# Patient Record
Sex: Male | Born: 1940 | Race: White | Hispanic: No | Marital: Married | Smoking: Never smoker
Health system: Southern US, Community
[De-identification: ages and names within clinical notes are randomized; demographics above are authoritative.]

## PROBLEM LIST (undated history)

## (undated) DIAGNOSIS — E785 Hyperlipidemia, unspecified: Secondary | ICD-10-CM

## (undated) DIAGNOSIS — E119 Type 2 diabetes mellitus without complications: Secondary | ICD-10-CM

## (undated) HISTORY — PX: TONSILLECTOMY: SUR1361

## (undated) HISTORY — PX: WISDOM TOOTH EXTRACTION: SHX21

## (undated) HISTORY — PX: PROSTATECTOMY: SHX69

---

## 1999-04-16 ENCOUNTER — Other Ambulatory Visit: Admission: RE | Admit: 1999-04-16 | Discharge: 1999-04-16 | Payer: Self-pay | Admitting: Urology

## 1999-06-02 ENCOUNTER — Encounter: Admission: RE | Admit: 1999-06-02 | Discharge: 1999-08-31 | Payer: Self-pay | Admitting: Radiation Oncology

## 1999-07-30 ENCOUNTER — Ambulatory Visit (HOSPITAL_COMMUNITY): Admission: RE | Admit: 1999-07-30 | Discharge: 1999-07-30 | Payer: Self-pay | Admitting: Gastroenterology

## 1999-08-05 ENCOUNTER — Encounter: Payer: Self-pay | Admitting: Urology

## 1999-08-10 ENCOUNTER — Inpatient Hospital Stay (HOSPITAL_COMMUNITY): Admission: RE | Admit: 1999-08-10 | Discharge: 1999-08-13 | Payer: Self-pay | Admitting: Urology

## 1999-08-14 ENCOUNTER — Emergency Department (HOSPITAL_COMMUNITY): Admission: EM | Admit: 1999-08-14 | Discharge: 1999-08-14 | Payer: Self-pay | Admitting: Emergency Medicine

## 2004-10-29 ENCOUNTER — Ambulatory Visit: Payer: Self-pay | Admitting: Family Medicine

## 2004-11-02 ENCOUNTER — Ambulatory Visit: Payer: Self-pay | Admitting: Family Medicine

## 2004-11-12 ENCOUNTER — Ambulatory Visit: Payer: Self-pay | Admitting: Family Medicine

## 2004-11-16 ENCOUNTER — Ambulatory Visit: Payer: Self-pay | Admitting: Family Medicine

## 2005-03-01 ENCOUNTER — Ambulatory Visit: Payer: Self-pay | Admitting: Family Medicine

## 2005-05-14 ENCOUNTER — Ambulatory Visit: Payer: Self-pay | Admitting: Family Medicine

## 2015-07-15 DIAGNOSIS — L821 Other seborrheic keratosis: Secondary | ICD-10-CM | POA: Diagnosis not present

## 2015-07-15 DIAGNOSIS — C44622 Squamous cell carcinoma of skin of right upper limb, including shoulder: Secondary | ICD-10-CM | POA: Diagnosis not present

## 2015-07-15 DIAGNOSIS — D485 Neoplasm of uncertain behavior of skin: Secondary | ICD-10-CM | POA: Diagnosis not present

## 2015-07-15 DIAGNOSIS — I789 Disease of capillaries, unspecified: Secondary | ICD-10-CM | POA: Diagnosis not present

## 2015-08-01 DIAGNOSIS — L570 Actinic keratosis: Secondary | ICD-10-CM | POA: Diagnosis not present

## 2015-08-01 DIAGNOSIS — L820 Inflamed seborrheic keratosis: Secondary | ICD-10-CM | POA: Diagnosis not present

## 2015-08-01 DIAGNOSIS — C44621 Squamous cell carcinoma of skin of unspecified upper limb, including shoulder: Secondary | ICD-10-CM | POA: Diagnosis not present

## 2015-09-12 DIAGNOSIS — Z85828 Personal history of other malignant neoplasm of skin: Secondary | ICD-10-CM | POA: Diagnosis not present

## 2015-09-12 DIAGNOSIS — L820 Inflamed seborrheic keratosis: Secondary | ICD-10-CM | POA: Diagnosis not present

## 2015-09-12 DIAGNOSIS — L570 Actinic keratosis: Secondary | ICD-10-CM | POA: Diagnosis not present

## 2016-01-21 DIAGNOSIS — Z8546 Personal history of malignant neoplasm of prostate: Secondary | ICD-10-CM | POA: Diagnosis not present

## 2016-01-21 DIAGNOSIS — E785 Hyperlipidemia, unspecified: Secondary | ICD-10-CM | POA: Diagnosis not present

## 2016-01-21 DIAGNOSIS — R634 Abnormal weight loss: Secondary | ICD-10-CM | POA: Diagnosis not present

## 2016-01-22 DIAGNOSIS — R739 Hyperglycemia, unspecified: Secondary | ICD-10-CM | POA: Diagnosis not present

## 2016-01-26 DIAGNOSIS — E785 Hyperlipidemia, unspecified: Secondary | ICD-10-CM | POA: Diagnosis not present

## 2016-01-26 DIAGNOSIS — E119 Type 2 diabetes mellitus without complications: Secondary | ICD-10-CM | POA: Diagnosis not present

## 2016-01-26 DIAGNOSIS — Z23 Encounter for immunization: Secondary | ICD-10-CM | POA: Diagnosis not present

## 2016-01-26 DIAGNOSIS — Z794 Long term (current) use of insulin: Secondary | ICD-10-CM | POA: Diagnosis not present

## 2016-01-26 DIAGNOSIS — R739 Hyperglycemia, unspecified: Secondary | ICD-10-CM | POA: Diagnosis not present

## 2016-02-04 DIAGNOSIS — E119 Type 2 diabetes mellitus without complications: Secondary | ICD-10-CM | POA: Diagnosis not present

## 2016-02-04 DIAGNOSIS — R739 Hyperglycemia, unspecified: Secondary | ICD-10-CM | POA: Diagnosis not present

## 2016-02-04 DIAGNOSIS — Z794 Long term (current) use of insulin: Secondary | ICD-10-CM | POA: Diagnosis not present

## 2016-03-02 DIAGNOSIS — H2513 Age-related nuclear cataract, bilateral: Secondary | ICD-10-CM | POA: Diagnosis not present

## 2016-03-02 DIAGNOSIS — E119 Type 2 diabetes mellitus without complications: Secondary | ICD-10-CM | POA: Diagnosis not present

## 2016-03-08 DIAGNOSIS — E119 Type 2 diabetes mellitus without complications: Secondary | ICD-10-CM | POA: Diagnosis not present

## 2016-03-08 DIAGNOSIS — Z0000 Encounter for general adult medical examination without abnormal findings: Secondary | ICD-10-CM | POA: Diagnosis not present

## 2016-03-08 DIAGNOSIS — Z794 Long term (current) use of insulin: Secondary | ICD-10-CM | POA: Diagnosis not present

## 2016-03-12 DIAGNOSIS — L821 Other seborrheic keratosis: Secondary | ICD-10-CM | POA: Diagnosis not present

## 2016-03-12 DIAGNOSIS — Z85828 Personal history of other malignant neoplasm of skin: Secondary | ICD-10-CM | POA: Diagnosis not present

## 2016-03-12 DIAGNOSIS — D225 Melanocytic nevi of trunk: Secondary | ICD-10-CM | POA: Diagnosis not present

## 2016-03-12 DIAGNOSIS — L570 Actinic keratosis: Secondary | ICD-10-CM | POA: Diagnosis not present

## 2016-03-12 DIAGNOSIS — L814 Other melanin hyperpigmentation: Secondary | ICD-10-CM | POA: Diagnosis not present

## 2016-05-25 DIAGNOSIS — R739 Hyperglycemia, unspecified: Secondary | ICD-10-CM | POA: Diagnosis not present

## 2016-05-25 DIAGNOSIS — R748 Abnormal levels of other serum enzymes: Secondary | ICD-10-CM | POA: Diagnosis not present

## 2016-05-25 DIAGNOSIS — E785 Hyperlipidemia, unspecified: Secondary | ICD-10-CM | POA: Diagnosis not present

## 2016-08-23 DIAGNOSIS — E119 Type 2 diabetes mellitus without complications: Secondary | ICD-10-CM | POA: Diagnosis not present

## 2016-08-23 DIAGNOSIS — E785 Hyperlipidemia, unspecified: Secondary | ICD-10-CM | POA: Diagnosis not present

## 2016-08-23 DIAGNOSIS — Z794 Long term (current) use of insulin: Secondary | ICD-10-CM | POA: Diagnosis not present

## 2016-08-23 DIAGNOSIS — R739 Hyperglycemia, unspecified: Secondary | ICD-10-CM | POA: Diagnosis not present

## 2016-09-14 DIAGNOSIS — Z85828 Personal history of other malignant neoplasm of skin: Secondary | ICD-10-CM | POA: Diagnosis not present

## 2016-09-14 DIAGNOSIS — D225 Melanocytic nevi of trunk: Secondary | ICD-10-CM | POA: Diagnosis not present

## 2016-09-14 DIAGNOSIS — D1801 Hemangioma of skin and subcutaneous tissue: Secondary | ICD-10-CM | POA: Diagnosis not present

## 2016-09-14 DIAGNOSIS — L821 Other seborrheic keratosis: Secondary | ICD-10-CM | POA: Diagnosis not present

## 2016-11-22 DIAGNOSIS — E119 Type 2 diabetes mellitus without complications: Secondary | ICD-10-CM | POA: Diagnosis not present

## 2016-11-22 DIAGNOSIS — Z794 Long term (current) use of insulin: Secondary | ICD-10-CM | POA: Diagnosis not present

## 2017-03-07 DIAGNOSIS — H2513 Age-related nuclear cataract, bilateral: Secondary | ICD-10-CM | POA: Diagnosis not present

## 2017-03-07 DIAGNOSIS — E119 Type 2 diabetes mellitus without complications: Secondary | ICD-10-CM | POA: Diagnosis not present

## 2017-03-11 DIAGNOSIS — Z8546 Personal history of malignant neoplasm of prostate: Secondary | ICD-10-CM | POA: Diagnosis not present

## 2017-03-11 DIAGNOSIS — Z0000 Encounter for general adult medical examination without abnormal findings: Secondary | ICD-10-CM | POA: Diagnosis not present

## 2017-03-11 DIAGNOSIS — E119 Type 2 diabetes mellitus without complications: Secondary | ICD-10-CM | POA: Diagnosis not present

## 2017-03-11 DIAGNOSIS — Z794 Long term (current) use of insulin: Secondary | ICD-10-CM | POA: Diagnosis not present

## 2017-03-11 DIAGNOSIS — E785 Hyperlipidemia, unspecified: Secondary | ICD-10-CM | POA: Diagnosis not present

## 2017-03-17 DIAGNOSIS — L820 Inflamed seborrheic keratosis: Secondary | ICD-10-CM | POA: Diagnosis not present

## 2017-03-17 DIAGNOSIS — D1801 Hemangioma of skin and subcutaneous tissue: Secondary | ICD-10-CM | POA: Diagnosis not present

## 2017-03-17 DIAGNOSIS — L814 Other melanin hyperpigmentation: Secondary | ICD-10-CM | POA: Diagnosis not present

## 2017-03-17 DIAGNOSIS — L570 Actinic keratosis: Secondary | ICD-10-CM | POA: Diagnosis not present

## 2017-03-17 DIAGNOSIS — Z85828 Personal history of other malignant neoplasm of skin: Secondary | ICD-10-CM | POA: Diagnosis not present

## 2017-03-17 DIAGNOSIS — D225 Melanocytic nevi of trunk: Secondary | ICD-10-CM | POA: Diagnosis not present

## 2017-03-17 DIAGNOSIS — L821 Other seborrheic keratosis: Secondary | ICD-10-CM | POA: Diagnosis not present

## 2017-06-10 DIAGNOSIS — E785 Hyperlipidemia, unspecified: Secondary | ICD-10-CM | POA: Diagnosis not present

## 2017-06-10 DIAGNOSIS — E119 Type 2 diabetes mellitus without complications: Secondary | ICD-10-CM | POA: Diagnosis not present

## 2017-06-10 DIAGNOSIS — Z794 Long term (current) use of insulin: Secondary | ICD-10-CM | POA: Diagnosis not present

## 2017-07-06 DIAGNOSIS — E1165 Type 2 diabetes mellitus with hyperglycemia: Secondary | ICD-10-CM | POA: Diagnosis not present

## 2017-07-06 DIAGNOSIS — E1169 Type 2 diabetes mellitus with other specified complication: Secondary | ICD-10-CM | POA: Diagnosis not present

## 2017-07-06 DIAGNOSIS — E785 Hyperlipidemia, unspecified: Secondary | ICD-10-CM | POA: Diagnosis not present

## 2017-07-26 DIAGNOSIS — Z713 Dietary counseling and surveillance: Secondary | ICD-10-CM | POA: Diagnosis not present

## 2017-07-26 DIAGNOSIS — E1165 Type 2 diabetes mellitus with hyperglycemia: Secondary | ICD-10-CM | POA: Diagnosis not present

## 2018-03-07 DIAGNOSIS — H2513 Age-related nuclear cataract, bilateral: Secondary | ICD-10-CM | POA: Diagnosis not present

## 2018-03-07 DIAGNOSIS — E119 Type 2 diabetes mellitus without complications: Secondary | ICD-10-CM | POA: Diagnosis not present

## 2018-03-17 DIAGNOSIS — C44729 Squamous cell carcinoma of skin of left lower limb, including hip: Secondary | ICD-10-CM | POA: Diagnosis not present

## 2018-05-01 DIAGNOSIS — C44729 Squamous cell carcinoma of skin of left lower limb, including hip: Secondary | ICD-10-CM | POA: Diagnosis not present

## 2018-05-15 DIAGNOSIS — Z79899 Other long term (current) drug therapy: Secondary | ICD-10-CM | POA: Diagnosis not present

## 2018-05-15 DIAGNOSIS — E1165 Type 2 diabetes mellitus with hyperglycemia: Secondary | ICD-10-CM | POA: Diagnosis not present

## 2018-05-15 DIAGNOSIS — E785 Hyperlipidemia, unspecified: Secondary | ICD-10-CM | POA: Diagnosis not present

## 2018-05-15 DIAGNOSIS — Z794 Long term (current) use of insulin: Secondary | ICD-10-CM | POA: Diagnosis not present

## 2018-05-15 DIAGNOSIS — E119 Type 2 diabetes mellitus without complications: Secondary | ICD-10-CM | POA: Diagnosis not present

## 2018-05-15 DIAGNOSIS — Z8546 Personal history of malignant neoplasm of prostate: Secondary | ICD-10-CM | POA: Diagnosis not present

## 2018-05-15 DIAGNOSIS — Z0000 Encounter for general adult medical examination without abnormal findings: Secondary | ICD-10-CM | POA: Diagnosis not present

## 2018-05-15 DIAGNOSIS — E1169 Type 2 diabetes mellitus with other specified complication: Secondary | ICD-10-CM | POA: Diagnosis not present

## 2018-06-15 DIAGNOSIS — C44529 Squamous cell carcinoma of skin of other part of trunk: Secondary | ICD-10-CM | POA: Diagnosis not present

## 2018-06-15 DIAGNOSIS — Z85828 Personal history of other malignant neoplasm of skin: Secondary | ICD-10-CM | POA: Diagnosis not present

## 2018-06-15 DIAGNOSIS — L905 Scar conditions and fibrosis of skin: Secondary | ICD-10-CM | POA: Diagnosis not present

## 2018-10-06 DIAGNOSIS — R7989 Other specified abnormal findings of blood chemistry: Secondary | ICD-10-CM | POA: Diagnosis not present

## 2018-10-06 DIAGNOSIS — Z794 Long term (current) use of insulin: Secondary | ICD-10-CM | POA: Diagnosis not present

## 2018-10-06 DIAGNOSIS — E785 Hyperlipidemia, unspecified: Secondary | ICD-10-CM | POA: Diagnosis not present

## 2018-10-06 DIAGNOSIS — E1165 Type 2 diabetes mellitus with hyperglycemia: Secondary | ICD-10-CM | POA: Diagnosis not present

## 2018-12-04 ENCOUNTER — Other Ambulatory Visit: Payer: Self-pay

## 2019-01-09 DIAGNOSIS — E119 Type 2 diabetes mellitus without complications: Secondary | ICD-10-CM | POA: Diagnosis not present

## 2019-01-09 DIAGNOSIS — Z794 Long term (current) use of insulin: Secondary | ICD-10-CM | POA: Diagnosis not present

## 2019-01-09 DIAGNOSIS — R7989 Other specified abnormal findings of blood chemistry: Secondary | ICD-10-CM | POA: Diagnosis not present

## 2019-01-09 DIAGNOSIS — E785 Hyperlipidemia, unspecified: Secondary | ICD-10-CM | POA: Diagnosis not present

## 2019-03-08 DIAGNOSIS — H2513 Age-related nuclear cataract, bilateral: Secondary | ICD-10-CM | POA: Diagnosis not present

## 2019-03-08 DIAGNOSIS — Z794 Long term (current) use of insulin: Secondary | ICD-10-CM | POA: Diagnosis not present

## 2019-03-08 DIAGNOSIS — E119 Type 2 diabetes mellitus without complications: Secondary | ICD-10-CM | POA: Diagnosis not present

## 2019-10-30 DIAGNOSIS — E1169 Type 2 diabetes mellitus with other specified complication: Secondary | ICD-10-CM | POA: Diagnosis not present

## 2019-10-30 DIAGNOSIS — E119 Type 2 diabetes mellitus without complications: Secondary | ICD-10-CM | POA: Diagnosis not present

## 2019-10-30 DIAGNOSIS — Z0000 Encounter for general adult medical examination without abnormal findings: Secondary | ICD-10-CM | POA: Diagnosis not present

## 2019-10-30 DIAGNOSIS — Z794 Long term (current) use of insulin: Secondary | ICD-10-CM | POA: Diagnosis not present

## 2019-10-30 DIAGNOSIS — E785 Hyperlipidemia, unspecified: Secondary | ICD-10-CM | POA: Diagnosis not present

## 2020-02-01 DIAGNOSIS — Z794 Long term (current) use of insulin: Secondary | ICD-10-CM | POA: Diagnosis not present

## 2020-02-01 DIAGNOSIS — E785 Hyperlipidemia, unspecified: Secondary | ICD-10-CM | POA: Diagnosis not present

## 2020-02-01 DIAGNOSIS — E669 Obesity, unspecified: Secondary | ICD-10-CM | POA: Diagnosis not present

## 2020-02-01 DIAGNOSIS — E119 Type 2 diabetes mellitus without complications: Secondary | ICD-10-CM | POA: Diagnosis not present

## 2020-02-01 DIAGNOSIS — E1169 Type 2 diabetes mellitus with other specified complication: Secondary | ICD-10-CM | POA: Diagnosis not present

## 2020-04-19 ENCOUNTER — Emergency Department (HOSPITAL_COMMUNITY): Payer: PPO

## 2020-04-19 ENCOUNTER — Inpatient Hospital Stay (HOSPITAL_COMMUNITY): Payer: PPO

## 2020-04-19 ENCOUNTER — Other Ambulatory Visit: Payer: Self-pay

## 2020-04-19 ENCOUNTER — Inpatient Hospital Stay (HOSPITAL_COMMUNITY)
Admission: EM | Admit: 2020-04-19 | Discharge: 2020-05-03 | DRG: 208 | Disposition: E | Payer: PPO | Attending: Internal Medicine | Admitting: Internal Medicine

## 2020-04-19 ENCOUNTER — Encounter (HOSPITAL_COMMUNITY): Payer: Self-pay | Admitting: Internal Medicine

## 2020-04-19 DIAGNOSIS — E1122 Type 2 diabetes mellitus with diabetic chronic kidney disease: Secondary | ICD-10-CM | POA: Diagnosis not present

## 2020-04-19 DIAGNOSIS — J1282 Pneumonia due to coronavirus disease 2019: Secondary | ICD-10-CM | POA: Diagnosis not present

## 2020-04-19 DIAGNOSIS — Z794 Long term (current) use of insulin: Secondary | ICD-10-CM | POA: Diagnosis not present

## 2020-04-19 DIAGNOSIS — I129 Hypertensive chronic kidney disease with stage 1 through stage 4 chronic kidney disease, or unspecified chronic kidney disease: Secondary | ICD-10-CM | POA: Diagnosis present

## 2020-04-19 DIAGNOSIS — Z4682 Encounter for fitting and adjustment of non-vascular catheter: Secondary | ICD-10-CM | POA: Diagnosis not present

## 2020-04-19 DIAGNOSIS — J9601 Acute respiratory failure with hypoxia: Secondary | ICD-10-CM | POA: Diagnosis present

## 2020-04-19 DIAGNOSIS — N179 Acute kidney failure, unspecified: Secondary | ICD-10-CM | POA: Diagnosis present

## 2020-04-19 DIAGNOSIS — I2699 Other pulmonary embolism without acute cor pulmonale: Secondary | ICD-10-CM

## 2020-04-19 DIAGNOSIS — U071 COVID-19: Secondary | ICD-10-CM | POA: Diagnosis not present

## 2020-04-19 DIAGNOSIS — G9341 Metabolic encephalopathy: Secondary | ICD-10-CM | POA: Diagnosis not present

## 2020-04-19 DIAGNOSIS — A4189 Other specified sepsis: Secondary | ICD-10-CM | POA: Diagnosis not present

## 2020-04-19 DIAGNOSIS — E1165 Type 2 diabetes mellitus with hyperglycemia: Secondary | ICD-10-CM | POA: Diagnosis not present

## 2020-04-19 DIAGNOSIS — Z8546 Personal history of malignant neoplasm of prostate: Secondary | ICD-10-CM

## 2020-04-19 DIAGNOSIS — Z87890 Personal history of sex reassignment: Secondary | ICD-10-CM | POA: Diagnosis not present

## 2020-04-19 DIAGNOSIS — E782 Mixed hyperlipidemia: Secondary | ICD-10-CM | POA: Diagnosis not present

## 2020-04-19 DIAGNOSIS — I10 Essential (primary) hypertension: Secondary | ICD-10-CM | POA: Diagnosis present

## 2020-04-19 DIAGNOSIS — I491 Atrial premature depolarization: Secondary | ICD-10-CM | POA: Diagnosis not present

## 2020-04-19 DIAGNOSIS — R0902 Hypoxemia: Secondary | ICD-10-CM | POA: Diagnosis not present

## 2020-04-19 DIAGNOSIS — R570 Cardiogenic shock: Secondary | ICD-10-CM | POA: Diagnosis not present

## 2020-04-19 DIAGNOSIS — J9312 Secondary spontaneous pneumothorax: Secondary | ICD-10-CM | POA: Diagnosis not present

## 2020-04-19 DIAGNOSIS — Z86711 Personal history of pulmonary embolism: Secondary | ICD-10-CM

## 2020-04-19 DIAGNOSIS — Z452 Encounter for adjustment and management of vascular access device: Secondary | ICD-10-CM

## 2020-04-19 DIAGNOSIS — E1169 Type 2 diabetes mellitus with other specified complication: Secondary | ICD-10-CM | POA: Diagnosis present

## 2020-04-19 DIAGNOSIS — J9311 Primary spontaneous pneumothorax: Secondary | ICD-10-CM | POA: Diagnosis not present

## 2020-04-19 DIAGNOSIS — Z823 Family history of stroke: Secondary | ICD-10-CM

## 2020-04-19 DIAGNOSIS — N1831 Chronic kidney disease, stage 3a: Secondary | ICD-10-CM | POA: Diagnosis present

## 2020-04-19 DIAGNOSIS — J939 Pneumothorax, unspecified: Secondary | ICD-10-CM | POA: Diagnosis not present

## 2020-04-19 DIAGNOSIS — Z4659 Encounter for fitting and adjustment of other gastrointestinal appliance and device: Secondary | ICD-10-CM

## 2020-04-19 DIAGNOSIS — R7989 Other specified abnormal findings of blood chemistry: Secondary | ICD-10-CM | POA: Diagnosis present

## 2020-04-19 DIAGNOSIS — R918 Other nonspecific abnormal finding of lung field: Secondary | ICD-10-CM | POA: Diagnosis not present

## 2020-04-19 DIAGNOSIS — I472 Ventricular tachycardia: Secondary | ICD-10-CM | POA: Diagnosis not present

## 2020-04-19 DIAGNOSIS — R34 Anuria and oliguria: Secondary | ICD-10-CM | POA: Diagnosis not present

## 2020-04-19 DIAGNOSIS — Z8249 Family history of ischemic heart disease and other diseases of the circulatory system: Secondary | ICD-10-CM

## 2020-04-19 DIAGNOSIS — J80 Acute respiratory distress syndrome: Secondary | ICD-10-CM | POA: Diagnosis not present

## 2020-04-19 DIAGNOSIS — R17 Unspecified jaundice: Secondary | ICD-10-CM | POA: Diagnosis not present

## 2020-04-19 DIAGNOSIS — Z66 Do not resuscitate: Secondary | ICD-10-CM | POA: Diagnosis not present

## 2020-04-19 DIAGNOSIS — R0689 Other abnormalities of breathing: Secondary | ICD-10-CM | POA: Diagnosis not present

## 2020-04-19 DIAGNOSIS — J9600 Acute respiratory failure, unspecified whether with hypoxia or hypercapnia: Secondary | ICD-10-CM

## 2020-04-19 DIAGNOSIS — J9382 Other air leak: Secondary | ICD-10-CM | POA: Diagnosis not present

## 2020-04-19 DIAGNOSIS — J9690 Respiratory failure, unspecified, unspecified whether with hypoxia or hypercapnia: Secondary | ICD-10-CM | POA: Diagnosis not present

## 2020-04-19 DIAGNOSIS — R6521 Severe sepsis with septic shock: Secondary | ICD-10-CM | POA: Diagnosis not present

## 2020-04-19 DIAGNOSIS — Z01818 Encounter for other preprocedural examination: Secondary | ICD-10-CM

## 2020-04-19 DIAGNOSIS — R0602 Shortness of breath: Secondary | ICD-10-CM | POA: Diagnosis not present

## 2020-04-19 DIAGNOSIS — J189 Pneumonia, unspecified organism: Secondary | ICD-10-CM | POA: Diagnosis not present

## 2020-04-19 DIAGNOSIS — R609 Edema, unspecified: Secondary | ICD-10-CM | POA: Diagnosis not present

## 2020-04-19 HISTORY — DX: Type 2 diabetes mellitus with other specified complication: E11.69

## 2020-04-19 HISTORY — DX: Hyperlipidemia, unspecified: E78.5

## 2020-04-19 HISTORY — DX: Mixed hyperlipidemia: E78.2

## 2020-04-19 HISTORY — DX: Type 2 diabetes mellitus without complications: E11.9

## 2020-04-19 LAB — CBC WITH DIFFERENTIAL/PLATELET
Abs Immature Granulocytes: 0.23 10*3/uL — ABNORMAL HIGH (ref 0.00–0.07)
Basophils Absolute: 0.1 10*3/uL (ref 0.0–0.1)
Basophils Relative: 1 %
Eosinophils Absolute: 0 10*3/uL (ref 0.0–0.5)
Eosinophils Relative: 0 %
HCT: 51.2 % (ref 39.0–52.0)
Hemoglobin: 17 g/dL (ref 13.0–17.0)
Immature Granulocytes: 1 %
Lymphocytes Relative: 9 %
Lymphs Abs: 1.8 10*3/uL (ref 0.7–4.0)
MCH: 30.4 pg (ref 26.0–34.0)
MCHC: 33.2 g/dL (ref 30.0–36.0)
MCV: 91.4 fL (ref 80.0–100.0)
Monocytes Absolute: 0.7 10*3/uL (ref 0.1–1.0)
Monocytes Relative: 3 %
Neutro Abs: 16.9 10*3/uL — ABNORMAL HIGH (ref 1.7–7.7)
Neutrophils Relative %: 86 %
Platelets: 329 10*3/uL (ref 150–400)
RBC: 5.6 MIL/uL (ref 4.22–5.81)
RDW: 13.1 % (ref 11.5–15.5)
WBC: 19.7 10*3/uL — ABNORMAL HIGH (ref 4.0–10.5)
nRBC: 0 % (ref 0.0–0.2)

## 2020-04-19 LAB — RESP PANEL BY RT-PCR (FLU A&B, COVID) ARPGX2
Influenza A by PCR: NEGATIVE
Influenza B by PCR: NEGATIVE
SARS Coronavirus 2 by RT PCR: POSITIVE — AB

## 2020-04-19 LAB — BLOOD GAS, ARTERIAL
Acid-base deficit: 1.5 mmol/L (ref 0.0–2.0)
Bicarbonate: 20.8 mmol/L (ref 20.0–28.0)
FIO2: 21
O2 Saturation: 87.5 %
Patient temperature: 98.6
pCO2 arterial: 30.1 mmHg — ABNORMAL LOW (ref 32.0–48.0)
pH, Arterial: 7.454 — ABNORMAL HIGH (ref 7.350–7.450)
pO2, Arterial: 58.8 mmHg — ABNORMAL LOW (ref 83.0–108.0)

## 2020-04-19 LAB — COMPREHENSIVE METABOLIC PANEL
ALT: 23 U/L (ref 0–44)
AST: 28 U/L (ref 15–41)
Albumin: 2.6 g/dL — ABNORMAL LOW (ref 3.5–5.0)
Alkaline Phosphatase: 96 U/L (ref 38–126)
Anion gap: 19 — ABNORMAL HIGH (ref 5–15)
BUN: 41 mg/dL — ABNORMAL HIGH (ref 8–23)
CO2: 19 mmol/L — ABNORMAL LOW (ref 22–32)
Calcium: 8.5 mg/dL — ABNORMAL LOW (ref 8.9–10.3)
Chloride: 99 mmol/L (ref 98–111)
Creatinine, Ser: 1.29 mg/dL — ABNORMAL HIGH (ref 0.61–1.24)
Glucose, Bld: 158 mg/dL — ABNORMAL HIGH (ref 70–99)
Potassium: 3.9 mmol/L (ref 3.5–5.1)
Sodium: 137 mmol/L (ref 135–145)
Total Bilirubin: 1.7 mg/dL — ABNORMAL HIGH (ref 0.3–1.2)
Total Protein: 7.9 g/dL (ref 6.5–8.1)

## 2020-04-19 LAB — LACTATE DEHYDROGENASE: LDH: 411 U/L — ABNORMAL HIGH (ref 98–192)

## 2020-04-19 LAB — FIBRINOGEN: Fibrinogen: >800 mg/dL — ABNORMAL HIGH (ref 210–475)

## 2020-04-19 LAB — D-DIMER, QUANTITATIVE: D-Dimer, Quant: >20 ug/mL-FEU — ABNORMAL HIGH (ref 0.00–0.50)

## 2020-04-19 LAB — C-REACTIVE PROTEIN: CRP: 31.4 mg/dL — ABNORMAL HIGH (ref <1.0)

## 2020-04-19 LAB — TROPONIN I (HIGH SENSITIVITY): Troponin I (High Sensitivity): 8 ng/L (ref <18)

## 2020-04-19 LAB — FERRITIN: Ferritin: 1054 ng/mL — ABNORMAL HIGH (ref 24–336)

## 2020-04-19 LAB — PROCALCITONIN: Procalcitonin: 0.45 ng/mL

## 2020-04-19 MED ORDER — GUAIFENESIN-DM 100-10 MG/5ML PO SYRP: 10.0000 mL | ORAL_SOLUTION | ORAL | Status: DC | PRN

## 2020-04-19 MED ORDER — ZINC SULFATE 220 (50 ZN) MG PO CAPS
220.0000 mg | ORAL_CAPSULE | Freq: Every day | ORAL | Status: DC
  Administered 2020-04-21: 12:00:00 220 mg via ORAL
  Filled 2020-04-19: qty 1

## 2020-04-19 MED ORDER — ALBUTEROL SULFATE HFA 108 (90 BASE) MCG/ACT IN AERS: 2.0000 | INHALATION_SPRAY | RESPIRATORY_TRACT | Status: DC | PRN

## 2020-04-19 MED ORDER — HYDROCODONE-ACETAMINOPHEN 5-325 MG PO TABS
1.0000 | ORAL_TABLET | Freq: Four times a day (QID) | ORAL | Status: DC | PRN
Start: 2020-04-19 — End: 2020-04-19

## 2020-04-19 MED ORDER — FENTANYL CITRATE (PF) 100 MCG/2ML IJ SOLN: 100.0000 ug | Freq: Once | INTRAMUSCULAR | Status: AC

## 2020-04-19 MED ORDER — BARICITINIB 2 MG PO TABS
2.0000 mg | ORAL_TABLET | Freq: Every day | ORAL | Status: DC
  Administered 2020-04-20: 01:00:00 2 mg via ORAL
  Filled 2020-04-19 (×2): qty 1

## 2020-04-19 MED ORDER — ORAL CARE MOUTH RINSE
15.0000 mL | Freq: Two times a day (BID) | OROMUCOSAL | Status: DC
  Administered 2020-04-20 (×2): 15 mL via OROMUCOSAL

## 2020-04-19 MED ORDER — LACTATED RINGERS IV BOLUS
1000.0000 mL | Freq: Once | INTRAVENOUS | Status: AC
  Administered 2020-04-20: 1000 mL via INTRAVENOUS

## 2020-04-19 MED ORDER — LIDOCAINE HCL 2 % IJ SOLN
INTRAMUSCULAR | Status: AC
  Filled 2020-04-19: qty 20

## 2020-04-19 MED ORDER — ACETAMINOPHEN 325 MG PO TABS: 650.0000 mg | ORAL_TABLET | Freq: Four times a day (QID) | ORAL | Status: DC | PRN

## 2020-04-19 MED ORDER — ONDANSETRON HCL 4 MG/2ML IJ SOLN: 4.0000 mg | Freq: Four times a day (QID) | INTRAMUSCULAR | Status: DC | PRN

## 2020-04-19 MED ORDER — MORPHINE SULFATE (PF) 2 MG/ML IV SOLN: 2.0000 mg | INTRAVENOUS | Status: DC | PRN

## 2020-04-19 MED ORDER — SODIUM CHLORIDE 0.9 % IV SOLN
200.0000 mg | Freq: Once | INTRAVENOUS | Status: AC
  Administered 2020-04-19: 20:00:00 200 mg via INTRAVENOUS
  Filled 2020-04-19: qty 200

## 2020-04-19 MED ORDER — CHLORHEXIDINE GLUCONATE CLOTH 2 % EX PADS
6.0000 | MEDICATED_PAD | Freq: Every day | CUTANEOUS | Status: DC
  Administered 2020-04-20 – 2020-04-22 (×4): 6 via TOPICAL

## 2020-04-19 MED ORDER — HYDROCODONE-ACETAMINOPHEN 5-325 MG PO TABS: 1.0000 | ORAL_TABLET | ORAL | Status: DC | PRN

## 2020-04-19 MED ORDER — SODIUM CHLORIDE 0.9 % IV SOLN
100.0000 mg | Freq: Every day | INTRAVENOUS | Status: DC
  Administered 2020-04-20 – 2020-04-22 (×3): 100 mg via INTRAVENOUS
  Filled 2020-04-19 (×3): qty 20

## 2020-04-19 MED ORDER — INSULIN ASPART 100 UNIT/ML ~~LOC~~ SOLN
0.0000 [IU] | Freq: Four times a day (QID) | SUBCUTANEOUS | Status: DC
  Administered 2020-04-20: 05:00:00 3 [IU] via SUBCUTANEOUS

## 2020-04-19 MED ORDER — SODIUM CHLORIDE 0.9% FLUSH
10.0000 mL | Freq: Three times a day (TID) | INTRAVENOUS | Status: DC
  Administered 2020-04-20 – 2020-04-21 (×6): 10 mL

## 2020-04-19 MED ORDER — ASCORBIC ACID 500 MG PO TABS
500.0000 mg | ORAL_TABLET | Freq: Every day | ORAL | Status: DC
  Administered 2020-04-21: 12:00:00 500 mg via ORAL
  Filled 2020-04-19: qty 1

## 2020-04-19 MED ORDER — DEXAMETHASONE SODIUM PHOSPHATE 10 MG/ML IJ SOLN
6.0000 mg | INTRAMUSCULAR | Status: DC
  Administered 2020-04-19: 19:00:00 6 mg via INTRAVENOUS
  Filled 2020-04-19: qty 1

## 2020-04-19 MED ORDER — POLYETHYLENE GLYCOL 3350 17 G PO PACK: 17.0000 g | PACK | Freq: Every day | ORAL | Status: DC | PRN

## 2020-04-19 MED ORDER — LACTATED RINGERS IV SOLN: INTRAVENOUS | Status: AC

## 2020-04-19 MED ORDER — ONDANSETRON HCL 4 MG PO TABS: 4.0000 mg | ORAL_TABLET | Freq: Four times a day (QID) | ORAL | Status: DC | PRN

## 2020-04-19 MED ORDER — ENOXAPARIN SODIUM 40 MG/0.4ML ~~LOC~~ SOLN: 40.0000 mg | SUBCUTANEOUS | Status: DC

## 2020-04-19 MED ORDER — LORAZEPAM 2 MG/ML IJ SOLN
2.0000 mg | Freq: Once | INTRAMUSCULAR | Status: DC
  Filled 2020-04-19: qty 1

## 2020-04-19 MED ORDER — IOHEXOL 350 MG/ML SOLN
100.0000 mL | Freq: Once | INTRAVENOUS | Status: AC | PRN
  Administered 2020-04-20: 100 mL via INTRAVENOUS

## 2020-04-19 MED ORDER — CHLORHEXIDINE GLUCONATE 0.12 % MT SOLN
15.0000 mL | Freq: Two times a day (BID) | OROMUCOSAL | Status: DC
  Administered 2020-04-20 (×3): 15 mL via OROMUCOSAL
  Filled 2020-04-19 (×2): qty 15

## 2020-04-19 MED ORDER — FENTANYL CITRATE (PF) 100 MCG/2ML IJ SOLN
INTRAMUSCULAR | Status: AC
  Administered 2020-04-19: 21:00:00 100 ug via INTRAVENOUS
  Filled 2020-04-19: qty 2

## 2020-04-19 MED ORDER — INSULIN GLARGINE 100 UNIT/ML ~~LOC~~ SOLN
10.0000 [IU] | Freq: Every day | SUBCUTANEOUS | Status: DC
  Administered 2020-04-20 – 2020-04-22 (×3): 10 [IU] via SUBCUTANEOUS
  Filled 2020-04-19 (×3): qty 0.1

## 2020-04-19 NOTE — ED Notes (Signed)
Pt to person and place. Pt is on high flow with nonrebreather. Pt is sating 85 to 90. Pt lung sound on right are diminished and on the left are rhochi

## 2020-04-19 NOTE — H&P (Signed)
History and Physical    Cody Morgan FBP:[id number] DOB: 28-Nov-1940 DOA: 04/21/2020  PCP: Pcp, No  Patient coming from: Home via EMS   Chief Complaint:  Chief Complaint  Patient presents with  . Shortness of Breath     HPI:    79 year old patient that was assigned male sex at birth and now identifies as male or male gender with past medical history of diabetes mellitus type 2, hyperlipidemia, hypertension, remote history of prostate cancer, obesity and remote history of gender firming surgery of the genitalia approximately 20 years ago who presents to [facility] long hospital emergency department with severe shortness of breath via EMS.  Patient is currently a poor historian and majority the history has been obtained from the daughter via phone conversation.   According to the daughter, the patient and his wife both began to develop shortness of breath and cough the weekend after Thanksgiving.  At the time, the patient's symptoms were mild and he remained at home with his wife in the weeks that followed.  The patient did not seek medical attention.  The patient was not vaccinated for COVID-19.  In the weeks that followed both the patient's and his wife symptoms continued to worsen.  Patient's wife decompensated to the point where she had to be admitted to [facility] long hospital on 12/8.  His wife clinically declined and unfortunately expired on 12/15 of Covid related complications.  According to the daughter, in the past 48 hours in particular patient developed rapidly worsening shortness of breath.  Patient has also been experiencing associated weakness and extremely poor oral intake in the past several days.  EMS was contacted today and the patient was urgently brought into [facility] hospital emergency from and for evaluation.  Upon evaluation in the emergency room patient was found to be hypoxic with oxygen saturations in the 70s with evidence of a right-sided pneumothorax.  Chest  tube with successfully placed by Dr. Eulis Foster with the emergency department.  Dr. Eulis Foster also consulted Dr. Carlis Abbott with CCM who graciously came to evaluate the patient.  Dr. Carlis Abbott felt that status post chest tube placement the patient is clinically somewhat improved and recommended hospitalization on the hospitalist service.  The hospitalist group has now been called to assess the patient admission the hospital  Review of Systems:   Review of Systems  Constitutional: Positive for malaise/fatigue.  Respiratory: Positive for cough and shortness of breath.   Neurological: Positive for weakness.  All other systems reviewed and are negative.   Past Medical History:  Diagnosis Date  . Diabetes mellitus type 2 in nonobese (HCC)   . HLD (hyperlipidemia)   . Mixed hyperlipidemia due to type 2 diabetes mellitus ([place]) 04/20/2020    Past Surgical History:  Procedure Laterality Date  . PROSTATECTOMY    . TONSILLECTOMY    . WISDOM TOOTH EXTRACTION       reports that he has never smoked. He has never used smokeless tobacco. He reports previous alcohol use. No history on file for drug use.  Not on File  Family History  Problem Relation Age of Onset  . Cancer Mother   . Heart disease Father   . Stroke Maternal Grandmother   . Heart disease Paternal Grandfather      Prior to Admission medications   Not on File    Physical Exam: Vitals:   04/28/2020 1945 04/14/2020 2000 04/09/2020 2015 04/08/2020 2030  BP: 120/77 130/81 123/76 (!) 142/85  Pulse: (!) 101  (!) 101 Marland Kitchen)  111  Resp: (!) 37  (!) 46 (!) 39  Temp:      TempSrc:      SpO2:   (!) 87% (!) 85%   Constitutional: Lethargic but arousable and oriented x2, patient is currently in respiratory distress.   Skin: no rashes, no lesions, notably poor skin turgor.   Eyes: Pupils are equally reactive to light.  No evidence of scleral icterus or conjunctival pallor.  ENMT: Dry mucous membranes noted.  Posterior pharynx clear of any exudate or lesions.    Neck: normal, supple, no masses, no thyromegaly.  No evidence of jugular venous distension.   Respiratory: Slightly diminished breath sounds throughout the right lung fields although improved status post chest tube placement.  Diffuse rales noted bilaterally.  Patient continues to be tachypneic without evidence of accessory muscle use. Cardiovascular: Tachycardic rate with regular rhythm no murmurs / rubs / gallops. No extremity edema. 2+ pedal pulses. No carotid bruits.  Chest:   Right-sided chest tube is in place.  Dressings clean dry and intact with surrounding tenderness.  No evidence of  crepitus or deformity.   Back:   Nontender without crepitus or deformity. Abdomen: Abdomen is soft and nontender.  No evidence of intra-abdominal masses.  Positive bowel sounds noted in all quadrants.   Musculoskeletal: No joint deformity upper and lower extremities. Good ROM, no contractures. Normal muscle tone.  Neurologic: Patient is lethargic and arousable but oriented x2.  Sensation intact.  Patient moving all 4 extremities spontaneously.  Patient is following all commands.  Patient is responsive to verbal stimuli.   Psychiatric: Psychiatric evaluation limited due to lethargy and intermittent confusion.  Patient currently does not seem to possess insight as to his current situation.  Labs on Admission: I have personally reviewed following labs and imaging studies -   CBC: Recent Labs  Lab 04/21/2020 1856  WBC 19.7*  NEUTROABS 16.9*  HGB 17.0  HCT 51.2  MCV 91.4  PLT 263   Basic Metabolic Panel: Recent Labs  Lab 04/17/2020 1856  NA 137  K 3.9  CL 99  CO2 19*  GLUCOSE 158*  BUN 41*  CREATININE 1.29*  CALCIUM 8.5*   GFR: CrCl cannot be calculated (Unknown ideal weight.). Liver Function Tests: Recent Labs  Lab 04/25/2020 1856  AST 28  ALT 23  ALKPHOS 96  BILITOT 1.7*  PROT 7.9  ALBUMIN 2.6*   No results for input(s): LIPASE, AMYLASE in the last 168 hours. No results for input(s):  AMMONIA in the last 168 hours. Coagulation Profile: No results for input(s): INR, PROTIME in the last 168 hours. Cardiac Enzymes: No results for input(s): CKTOTAL, CKMB, CKMBINDEX, TROPONINI in the last 168 hours. BNP (last 3 results) No results for input(s): PROBNP in the last 8760 hours. HbA1C: No results for input(s): HGBA1C in the last 72 hours. CBG: No results for input(s): GLUCAP in the last 168 hours. Lipid Profile: No results for input(s): CHOL, HDL, LDLCALC, TRIG, CHOLHDL, LDLDIRECT in the last 72 hours. Thyroid Function Tests: No results for input(s): TSH, T4TOTAL, FREET4, T3FREE, THYROIDAB in the last 72 hours. Anemia Panel: Recent Labs    04/16/2020 1856  FERRITIN 1,054*   Urine analysis: No results found for: COLORURINE, APPEARANCEUR, LABSPEC, PHURINE, GLUCOSEU, HGBUR, BILIRUBINUR, KETONESUR, PROTEINUR, UROBILINOGEN, NITRITE, LEUKOCYTESUR  Radiological Exams on Admission - Personally Reviewed: DG Chest 1 View  Result Date: 04/21/2020 CLINICAL DATA:  Right-sided chest tube placement, pneumothorax EXAM: CHEST  1 VIEW COMPARISON:  04/26/2020 FINDINGS: Single frontal view of  the chest demonstrates right-sided pigtail drainage catheter coiled over the medial right lung base. Near complete resolution of right pneumothorax, with less than 10% residual right apical pneumothorax identified. Widespread bilateral airspace disease consistent with known history of COVID-19 pneumonia. No effusion. Cardiac silhouette obscured by lung consolidation. IMPRESSION: 1. Near complete resolution of right pneumothorax after pigtail drainage catheter placement. 2. Widespread bilateral airspace disease consistent with known COVID-19 pneumonia. Electronically Signed   By: Randa Ngo M.D.   On: 04/14/2020 21:21   DG Chest Port 1 View  Result Date: 04/17/2020 CLINICAL DATA:  Short of breath for several days, hypoxia EXAM: PORTABLE CHEST 1 VIEW COMPARISON:  None. FINDINGS: Single frontal view of  the chest demonstrates large right-sided pneumothorax volume estimated greater than 50%. No evidence of mediastinal shift or tension effect. There is widespread bilateral airspace disease which may reflect multifocal pneumonia or edema. No pleural effusion. Cardiac silhouette is obscured by the lung consolidation. No acute bony abnormalities. IMPRESSION: 1. Large right pneumothorax, volume estimated greater than 50%. No tension effect or midline shift. 2. Widespread bilateral airspace disease which may reflect multifocal infection or edema. Critical Value/emergent results were called by telephone at the time of interpretation on 04/14/2020 at 8:12 pm to provider [facility] , who verbally acknowledged these results. Electronically Signed   By: Randa Ngo M.D.   On: 04/20/2020 20:14    Telemetry: Personally reviewed.  Rhythm is sinus tachycardia heart rate of 110 bpm.  Assessment/Plan Principal Problem:   Acute respiratory failure with hypoxia (HCC)  Evidence of severe acute hypoxic respiratory failure with PaO2 of 58 on ABG  This is presumably secondary to a combination of severe COVID-19 pneumonia and right-sided pneumothorax (now S/P chest tube placement)  Due to severe hypoxia we will transition patient to heated high flow oxygen delivery  Initiating intravenous dexamethasone  Initiating intravenous remdesivir  With essentially normal procalcitonin, initiation of Barcitinib  due to severe disease is warranted.  Blood cultures and lactic acid have been ordered  As needed bronchodilator therapy via MDI  Additionally providing patient with as needed antitussives  Additionally providing patient with zinc and vitamin C supplements  Prognosis is guarded  Admitting patient to stepdown unit, CCM to continue to follow for chest tube management -their assistance is appreciated.  Active Problems:   Pneumothorax on right   Status post right chest tube placement by Dr. Eulis Foster in the  emergency department  Chest x-ray status post chest tube placement reveals reexpansion of the right lung field  CCM to continue to follow for chest tube management-their assistance appreciated    Pneumonia due to COVID-19 virus   Please see assessment and plan above    Elevated d-dimer   Markedly elevated D-dimer likely secondary to underlying severe COVID-19 infection  Obtaining CT angiogram of the chest, bilateral lower extremity ultrasound  If patient does have evidence of thromboembolic disease -initiation of full dose anticoagulation would be warranted despite recent insertion of chest tube because benefit outweighs risk    Acute metabolic encephalopathy   Secondary to underlying hypoxia and severe COVID-19 infection  Hydrating patient with intravenous isotonic fluids, managing hypoxia with heated high flow oxygen delivery, treating underlying Covid infection  Monitoring for symptomatic improvement    Type 2 diabetes mellitus with hyperglycemia, with long-term current use of insulin (HCC)   Accu-Cheks q. of 6 hours while n.p.o. with sliding scale insulin  Due to patient being n.p.o. due to tenuous clinical status, will place patient on markedly  reduced regimen of basal insulin at 10 units Qdaily opposed to patient's typical regimen of 38 units daily.  Hemoglobin A1c pending    Essential hypertension   We will consider resumption of home regimen of antihypertensive therapy once medication reconciliation is confirmed as patient tolerates it  For now, will provide patient with as needed intravenous antihypertensives for markedly elevated blood pressures.    Status post gender reassignment surgery   Patient assigned male sex at birth however patient now identifies as either male or male gender  Patient is status post male to male gender affirming surgery approximately 20 years ago  Patient states he responds to either him/his or she/hers pronouns and has no  preference.    Mixed hyperlipidemia due to type 2 diabetes mellitus ([place])    Holding home regimen of lipid-lowering therapy for now until patient is consistently tolerating oral intake and home medication reconciliation is confirmed   Code Status:  Full code Family Communication: Patient's condition discussed at length with both the daughter and son via separate phone conversations.  Status is: Inpatient  Remains inpatient appropriate because:Altered mental status, IV treatments appropriate due to intensity of illness or inability to take PO and Inpatient level of care appropriate due to severity of illness   Dispo: The patient is from: Home              Anticipated d/c is to: Home              Anticipated d/c date is: > 3 days              Patient currently is not medically stable to d/c.        Vernelle Emerald MD [facility] Pager [phone]  If 7PM-7AM, please contact night-coverage [email or web address] Use universal  password for that web site. If you do not have the password, please call the hospital operator.  04/26/2020, 11:05 PM

## 2020-04-19 NOTE — ED Notes (Signed)
Time out complete, right pt. Right location ( right chest wall), pt acknowledged procedure  And location

## 2020-04-19 NOTE — ED Triage Notes (Signed)
Pt BIBA from home.    Per EMS-  Pt c/o ShoB x"a few days" When EMS arrived to scene pts O2 so low that it would not register on spo2 monitor.  Pt arrived to ED on NRB at 15 L and O2 sats at 75%.  RT called to bedside, placed pt on salter hi-flow in addition to NRB, O2 sats increased to 83%.    Pt AOx4. Denies pain. Reports feeling unwell "few weeks"  EDP Wentz to bedside 1856.

## 2020-04-19 NOTE — Consult Note (Signed)
NAME:  Cody Morgan, MRN:  [id number], DOB:  15-Oct-1940, LOS: 0 ADMISSION DATE:  05/01/2020, CONSULTATION DATE:  12/18 REFERRING MD:  Vianne Bulls, CHIEF COMPLAINT:  Pneumothorax, respiratory failure  Brief History:  covid pneumonia, acute R pneumothorax  History of Present Illness:  Cody Morgan is an 79 y/o gentleman with history of DM who presents to the ED with shortness of breath.  When EMS arrived he had saturations in the 70s on room air.  In the ED he remained in the mid 80s on high flow cannula and nonrebreather.  He has been intermittently confused giving limited history.  He has been feeling poorly for several days.  Found to have Covid pneumonia and large R thorax.  His wife passed away several days ago from Covid pneumonia.  He is not vaccinated.  Since chest tube has been placed in the ED his saturations have been in the 90s and his encephalopathy is improved.  During his exam he denies acute complaints but provides limited additional history.  Past Medical History:  DM2 HLD  Significant Hospital Events:  Pigtail chest tube 12/18  Consults:  PCCM  Procedures:    Significant Diagnostic Tests:    Micro Data:  covid +  Blood cx 12/18>  Antimicrobials:  remdesivir Dexamethasone  Interim History / Subjective:    Objective   Blood pressure (!) 142/85, pulse (!) 111, temperature (!) 97.5 F (36.4 C), temperature source Oral, resp. rate (!) 39, SpO2 (!) 85 %.    FiO2 (%):  [100 %] 100 %  No intake or output data in the 24 hours ending 04/13/2020 2207 There were no vitals filed for this visit.  Examination: General: Ill-appearing man lying in bed no acute distress HENT: [place]/AT, eyes anicteric Lungs: Symmetric breath sounds, rales bilaterally.  Chest tube in place with intermittent air leak.  Mild tachypnea, no accessory muscle use or respiratory distress. Cardiovascular: Regular rate and rhythm, no murmurs Abdomen: Soft, nontender, nondistended Extremities: No  pitting edema, no clubbing or cyanosis, extremities warm. Neuro: Fatigued appearing, opening his eyes intermittently but giving short answers.  Moving all extremities. Derm: No rashes or wounds  Resolved Hospital Problem list    Assessment & Plan:  Right spontaneous pneumothorax secondary to Covid pneumonia -Chest tube to suction, -20 cm water. -Flush chest tube per pigtail catheter protocol, every 8 hours -Morning chest x-ray ordered -Norco as needed for chest tube related pain  Acute hypoxic respiratory failure due to COVID-19 viral pneumonia -Supplemental oxygen as required to maintain sats greater than mid 80s. -Awake proning -Out of bed mobility, incentive spirometry, flutter valve -Remdesivir, dexamethasone -With negative procalcitonin, consider baricitinib as leukocytosis could be explained by stress of pneumothorax.  Severelly elevated d-dimer -Agree with CTA -Leg doppler US  -Recommend empiric therapeutic anticoagulation; do not anticipate significant bleeding from chest tube.  Did not give the name of a surrogate decision maker during our encounter- he has children. Wife recently deceased. He would be fine with intubation if required.  Best practice (evaluated daily)  Per primary    Labs   CBC: Recent Labs  Lab 05/01/2020 1856  WBC 19.7*  NEUTROABS 16.9*  HGB 17.0  HCT 51.2  MCV 91.4  PLT 099    Basic Metabolic Panel: Recent Labs  Lab 04/16/2020 1856  NA 137  K 3.9  CL 99  CO2 19*  GLUCOSE 158*  BUN 41*  CREATININE 1.29*  CALCIUM 8.5*   GFR: CrCl cannot be calculated (Unknown ideal weight.). Recent  Labs  Lab 04/21/2020 1856  PROCALCITON 0.45  WBC 19.7*    Liver Function Tests: Recent Labs  Lab 04/14/2020 1856  AST 28  ALT 23  ALKPHOS 96  BILITOT 1.7*  PROT 7.9  ALBUMIN 2.6*   No results for input(s): LIPASE, AMYLASE in the last 168 hours. No results for input(s): AMMONIA in the last 168 hours.  ABG No results found for: PHART,  PCO2ART, PO2ART, HCO3, TCO2, ACIDBASEDEF, O2SAT   Coagulation Profile: No results for input(s): INR, PROTIME in the last 168 hours.  Cardiac Enzymes: No results for input(s): CKTOTAL, CKMB, CKMBINDEX, TROPONINI in the last 168 hours.  HbA1C: No results found for: HGBA1C  CBG: No results for input(s): GLUCAP in the last 168 hours.  Review of Systems:   Review of Systems  Constitutional: Positive for malaise/fatigue.  Respiratory: Positive for cough and shortness of breath.   Cardiovascular: Positive for chest pain. Negative for leg swelling.  Gastrointestinal: Negative.   Musculoskeletal: Negative for back pain and joint pain.  Skin: Negative for rash.  Neurological: Negative for focal weakness.     Past Medical History:  He,  has a past medical history of Diabetes mellitus type 2 in nonobese ([place]) and HLD (hyperlipidemia).   Surgical History:   Past Surgical History:  Procedure Laterality Date  . PROSTATECTOMY    . TONSILLECTOMY    . WISDOM TOOTH EXTRACTION       Social History:   reports that he has never smoked. He has never used smokeless tobacco. He reports previous alcohol use.   Family History:  His family history includes Cancer in his mother; Heart disease in his father and paternal grandfather; Stroke in his maternal grandmother.   Allergies Not on File   Home Medications  Per [facility] records in care everywhere as of 02/01/20:  Current Outpatient Medications:  . Alcohol Swabs 70 % PADS, Use for insulin and lacnets, Disp: 200 each, Rfl: 5 . atorvastatin calcium (LIPITOR) 10 mg tablet, TAKE ONE TABLET (10 MG DOSE) BY MOUTH AT BEDTIME., Disp: 90 tablet, Rfl: 1 . BD PEN NEEDLE MINI U/F 31G X 5 MM MISC, USE DAILY WITH LEVEMIR PEN, Disp: 90 each, Rfl: 3 . Blood Glucose Monitoring Suppl (FREESTYLE LITE) DEVI, USE AS DIRECTED, Disp: , Rfl: 0 . Cholecalciferol (VITAMIN D) 25 mcg (1000 Units) tablet, Take 1,000 Units by mouth daily., Disp: , Rfl:  . glucose blood  (FREESTYLE LITE TEST) test strip, USE TO CHECK BLOOD SUGAR 4 TIMES A DAY, Disp: 100 each, Rfl: 11 . Lancets (ONETOUCH ULTRASOFT) lancets, Use as instructed, Disp: 100 each, Rfl: 5 . metFORMIN ER (GLUCOPHAGE-XR) 500 mg 24 hr tablet, TAKE 2 TABLETS BY MOUTH TWICE A DAY, Disp: 360 tablet, Rfl: 2 . SOLIQUA 100-33 UNT-MCG/ML SOPN injection, INJECT 0.38 MLS (38 UNITS DOSE) INTO THE SKIN 30 (THIRTY) MINUTES BEFORE BREAKFAST., Disp: 15 mL, Rfl: 5 . vitamin B-12 (CYANOCOBALAMIN) 1000 mcg tablet, Take 1,000 mcg by mouth daily., Disp: , Rfl:       Critical care time:     Julian Hy, DO 04/26/2020 10:07 PM  [facility]

## 2020-04-19 NOTE — ED Provider Notes (Signed)
[facility] DEPT Provider Note   CSN: [id number] Arrival date & time: 04/30/2020  1850     History Chief Complaint  Patient presents with  . Shortness of Breath    Cody Morgan is a 79 y.o. male.  HPI He presents by EMS for evaluation of shortness of breath. EMS found him to be hypoxic, with room air oxygenation in the 70s. He was treated with facemask oxygen, 100% and transfer. He arrived here hypoxic.  Patient is able to speak and give history however is confused unable to give complete history. Reports by staff who are aware of his family history; indicate that the patient's wife died from a Covid infection, 2 days ago.  Level 5 caveat-altered mental status    No past medical history on file.  Patient Active Problem List   Diagnosis Date Noted  . Acute respiratory failure with hypoxia ([place]) 04/05/2020    Past Surgical History:  Procedure Laterality Date  . PROSTATECTOMY    . TONSILLECTOMY    . WISDOM TOOTH EXTRACTION         No family history on file.     Home Medications Prior to Admission medications   Not on File    Allergies    Patient has no allergy information on record.  Review of Systems   Review of Systems  Unable to perform ROS: Mental status change    Physical Exam Updated Vital Signs BP (!) 142/85   Pulse (!) 111   Temp (!) 97.5 F (36.4 C) (Oral)   Resp (!) 39   SpO2 (!) 85%   Physical Exam Vitals and nursing note reviewed.  Constitutional:      General: He is not in acute distress.    Appearance: He is well-developed and well-nourished. He is not ill-appearing, toxic-appearing or diaphoretic.  HENT:     Head: Normocephalic and atraumatic.     Right Ear: External ear normal.     Left Ear: External ear normal.  Eyes:     Extraocular Movements: EOM normal.     Conjunctiva/sclera: Conjunctivae normal.     Pupils: Pupils are equal, round, and reactive to light.  Neck:     Trachea: Phonation  normal.  Cardiovascular:     Rate and Rhythm: Regular rhythm. Tachycardia present.  Pulmonary:     Effort: Pulmonary effort is normal. No respiratory distress.     Breath sounds: No stridor.     Comments: He is tachypneic Chest:     Chest wall: No bony tenderness.  Abdominal:     General: There is no distension.     Palpations: Abdomen is soft.     Tenderness: There is no abdominal tenderness.  Musculoskeletal:        General: No swelling or tenderness. Normal range of motion.     Cervical back: Normal range of motion and neck supple.  Skin:    General: Skin is warm, dry and intact.     Coloration: Skin is not jaundiced or pale.  Neurological:     Mental Status: He is alert.     Cranial Nerves: No cranial nerve deficit.     Motor: No abnormal muscle tone.     Coordination: Coordination normal.  Psychiatric:        Mood and Affect: Mood and affect and mood normal.        Behavior: Behavior normal.     ED Results / Procedures / Treatments   Labs (all labs ordered  are listed, but only abnormal results are displayed) Labs Reviewed  RESP PANEL BY RT-PCR (FLU A&B, COVID) ARPGX2 - Abnormal; Notable for the following components:      Result Value   SARS Coronavirus 2 by RT PCR POSITIVE (*)    All other components within normal limits  C-REACTIVE PROTEIN - Abnormal; Notable for the following components:   CRP 31.4 (*)    All other components within normal limits  COMPREHENSIVE METABOLIC PANEL - Abnormal; Notable for the following components:   CO2 19 (*)    Glucose, Bld 158 (*)    BUN 41 (*)    Creatinine, Ser 1.29 (*)    Calcium 8.5 (*)    Albumin 2.6 (*)    Total Bilirubin 1.7 (*)    Anion gap 19.0 (*)    All other components within normal limits  CBC WITH DIFFERENTIAL/PLATELET - Abnormal; Notable for the following components:   WBC 19.7 (*)    Neutro Abs 16.9 (*)    Abs Immature Granulocytes 0.23 (*)    All other components within normal limits  D-DIMER, QUANTITATIVE  (NOT AT [facility]) - Abnormal; Notable for the following components:   D-Dimer, Quant >20.00 (*)    All other components within normal limits  FERRITIN - Abnormal; Notable for the following components:   Ferritin 1,054 (*)    All other components within normal limits  FIBRINOGEN - Abnormal; Notable for the following components:   Fibrinogen >800 (*)    All other components within normal limits  LACTATE DEHYDROGENASE - Abnormal; Notable for the following components:   LDH 411 (*)    All other components within normal limits  CULTURE, BLOOD (ROUTINE X 2)  CULTURE, BLOOD (ROUTINE X 2)  PROCALCITONIN  BLOOD GAS, ARTERIAL  HEMOGLOBIN A1C  PROTIME-INR  APTT  LACTIC ACID, PLASMA  TROPONIN I (HIGH SENSITIVITY)    EKG None  Radiology DG Chest 1 View  Result Date: 04/20/2020 CLINICAL DATA:  Right-sided chest tube placement, pneumothorax EXAM: CHEST  1 VIEW COMPARISON:  04/04/2020 FINDINGS: Single frontal view of the chest demonstrates right-sided pigtail drainage catheter coiled over the medial right lung base. Near complete resolution of right pneumothorax, with less than 10% residual right apical pneumothorax identified. Widespread bilateral airspace disease consistent with known history of COVID-19 pneumonia. No effusion. Cardiac silhouette obscured by lung consolidation. IMPRESSION: 1. Near complete resolution of right pneumothorax after pigtail drainage catheter placement. 2. Widespread bilateral airspace disease consistent with known COVID-19 pneumonia. Electronically Signed   By: Randa Ngo M.D.   On: 04/04/2020 21:21   DG Chest Port 1 View  Result Date: 04/02/2020 CLINICAL DATA:  Short of breath for several days, hypoxia EXAM: PORTABLE CHEST 1 VIEW COMPARISON:  None. FINDINGS: Single frontal view of the chest demonstrates large right-sided pneumothorax volume estimated greater than 50%. No evidence of mediastinal shift or tension effect. There is widespread bilateral airspace disease  which may reflect multifocal pneumonia or edema. No pleural effusion. Cardiac silhouette is obscured by the lung consolidation. No acute bony abnormalities. IMPRESSION: 1. Large right pneumothorax, volume estimated greater than 50%. No tension effect or midline shift. 2. Widespread bilateral airspace disease which may reflect multifocal infection or edema. Critical Value/emergent results were called by telephone at the time of interpretation on 04/21/2020 at 8:12 pm to provider [facility] , who verbally acknowledged these results. Electronically Signed   By: Randa Ngo M.D.   On: 04/16/2020 20:14    Procedures CHEST TUBE INSERTION  Date/Time: 04/21/2020 9:07 PM Performed by: Daleen Bo, MD Authorized by: Daleen Bo, MD   Consent:    Consent obtained:  Emergent situation   Consent given by:  Patient   Risks discussed:  Bleeding, incomplete drainage, infection and damage to surrounding structures   Alternatives discussed:  No treatment Universal protocol:    Procedure explained and questions answered to patient or proxy's satisfaction: yes     Immediately prior to procedure, a time out was called: yes     Patient identity confirmed:  Verbally with patient, arm band and hospital-assigned identification number Pre-procedure details:    Skin preparation:  Chlorhexidine   Preparation: Patient was prepped and draped in the usual sterile fashion   Sedation:    Sedation type:  None Anesthesia:    Anesthesia method:  Local infiltration   Local anesthetic:  Lidocaine 1% w/o epi Procedure details:    Placement location:  R anterior   Scalpel size:  11   Tube size ([place]): 14.   Ultrasound guidance: no     Tension pneumothorax: no     Tube connected to:  Suction   Drainage characteristics:  Air only   Suture material:  2-0 silk   Dressing:  Xeroform gauze and 4x4 sterile gauze Post-procedure details:    Post-insertion x-ray findings: tube in good position     Procedure  completion:  Tolerated well, no immediate complications .Critical Care Performed by: Daleen Bo, MD Authorized by: Daleen Bo, MD   Critical care provider statement:    Critical care time (minutes):  50   Critical care start time:  04/15/2020 6:50 PM   Critical care end time:  04/17/2020 9:46 PM   Critical care time was exclusive of:  Separately billable procedures and treating other patients   Critical care was necessary to treat or prevent imminent or life-threatening deterioration of the following conditions:  Respiratory failure   Critical care was time spent personally by me on the following activities:  Blood draw for specimens, development of treatment plan with patient or surrogate, discussions with consultants, evaluation of patient's response to treatment, examination of patient, obtaining history from patient or surrogate, ordering and performing treatments and interventions, ordering and review of laboratory studies, pulse oximetry, re-evaluation of patient's condition, review of old charts and ordering and review of radiographic studies   (including critical care time)  Medications Ordered in ED Medications  dexamethasone (DECADRON) injection 6 mg (6 mg Intravenous Given 04/23/2020 1926)  remdesivir 200 mg in sodium chloride 0.9% 250 mL IVPB (0 mg Intravenous Stopped 04/21/2020 2002)    Followed by  remdesivir 100 mg in sodium chloride 0.9 % 100 mL IVPB (has no administration in time range)  LORazepam (ATIVAN) injection 2 mg (has no administration in time range)  lidocaine (XYLOCAINE) 2 % (with pres) injection (has no administration in time range)  sodium chloride flush (NS) 0.9 % injection 10 mL (has no administration in time range)  fentaNYL (SUBLIMAZE) injection 100 mcg (100 mcg Intravenous Given 04/27/2020 2107)    ED Course  I have reviewed the triage vital signs and the nursing notes.  Pertinent labs & imaging results that were available during my care of the  patient were reviewed by me and considered in my medical decision making (see chart for details).  Clinical Course as of 04/29/2020 2144  Sat Apr 19, 2020  1904 On arrival the patient was placed on 100% facemask oxygen with 15 L per nasal cannula heated oxygen. Oxygen saturation  initially 80%, gradually improved to 84% while I was in the room. Patient was not in respiratory distress. [EW]  1904 Orders initiated for treatment of suspected Covid infection. Unable to tell how long the patient has been ill. [EW]  2016 I discussed case with Dr. Carlis Abbott, critical care physician.  She will see the patient as a Optometrist, and requested I proceed with chest tube placement. [EW]    Clinical Course User Index [EW] Daleen Bo, MD   MDM Rules/Calculators/A&P                           Patient Vitals for the past 24 hrs:  BP Temp Temp src Pulse Resp SpO2  05/02/2020 2030 (!) 142/85 -- -- (!) 111 (!) 39 (!) 85 %  04/18/2020 2015 123/76 -- -- (!) 101 (!) 46 (!) 87 %  04/23/2020 2000 130/81 -- -- -- -- --  04/07/2020 1945 120/77 -- -- (!) 101 (!) 37 --  04/18/2020 1930 112/83 -- -- (!) 109 (!) 35 (!) 83 %  04/14/2020 1915 123/83 -- -- (!) 102 (!) 41 (!) 84 %  04/26/2020 1909 126/85 (!) 97.5 F (36.4 C) Oral (!) 105 (!) 43 (!) 84 %    9:12 PM Reevaluation with update and discussion. After initial assessment and treatment, an updated evaluation reveals he tolerated pneumothorax treatment with [facility] catheter [place]. Oxygenation remained stable at 87%. No family members have arrived or can be contacted with available information. Daleen Bo   Medical Decision Making:  This patient is presenting for evaluation of hypoxia and suspected Covid infection, which does require a range of treatment options, and is a complaint that involves a high risk of morbidity and mortality. The differential diagnoses include Covid infection, pneumonia, metabolic disorder, volume depletion. I decided to review old records, and in  summary elderly male, whose wife recently died from Covid, presenting with signs and symptoms of Covid infection. Unable to gauge duration of illness.  I did not require additional historical information from anyone.  Clinical Laboratory Tests Ordered, included CBC, Metabolic panel and Covid inflammatory markers, Covid test, flu test. Review indicates consistent with acute Covid infection with elevated inflammatory markers and elevated D-dimer. Radiologic Tests Ordered, included chest x-ray.  I independently Visualized: Chest radiography images, which show signs for Covid pneumonia and greater than 50% pneumothorax right.  Cardiac Monitor Tracing which shows sinus tachycardia    Critical Interventions-clinical evaluation, laboratory testing, chest x-ray, oxygen to support saturation in the high 90s. Treated with both facemask and nasal cannula oxygen which has been heated. Chest tube placement for pneumothorax. Reassessment.  After These Interventions, the Patient was reevaluated and was found to require hospitalization for treatment of pneumothorax with Covid infection. Markedly elevated D-dimer which may reflect inflammatory process and not necessarily PE.  CRITICAL CARE-yes Performed by: Daleen Bo  Nursing Notes Reviewed/ Care Coordinated Applicable Imaging Reviewed Interpretation of Laboratory Data incorporated into ED treatment   9:12 PM-Consult complete with hospitalist. Patient case explained and discussed.  He agrees to admit patient for further evaluation and treatment. Call ended at 9:20 PM  Final Clinical Impression(s) / ED Diagnoses Final diagnoses:  COVID-19 virus infection  Primary spontaneous pneumothorax  Hypoxia    Rx / DC Orders ED Discharge Orders    None       Daleen Bo, MD 04/20/20 1139

## 2020-04-19 NOTE — ED Notes (Signed)
Date and time results received: 04/14/2020  (use smartphrase ".now" to insert current time)  Test: covi Critical Value: covid pos  Name of Provider Notified: wenz  Orders Received? Or Actions Taken?: see orders

## 2020-04-19 NOTE — ED Notes (Signed)
14 fr wilson drain place by provider in right sided chest wall. Drain was connected to [facility] , no bubbling noted small amount of blood in tube. Placement verified by x ray. Bandaged place over drain  Entrance after it was secured by provider

## 2020-04-20 ENCOUNTER — Encounter (HOSPITAL_COMMUNITY): Payer: Self-pay | Admitting: Internal Medicine

## 2020-04-20 ENCOUNTER — Inpatient Hospital Stay (HOSPITAL_COMMUNITY): Payer: PPO

## 2020-04-20 DIAGNOSIS — J939 Pneumothorax, unspecified: Secondary | ICD-10-CM

## 2020-04-20 DIAGNOSIS — R7989 Other specified abnormal findings of blood chemistry: Secondary | ICD-10-CM

## 2020-04-20 DIAGNOSIS — G9341 Metabolic encephalopathy: Secondary | ICD-10-CM

## 2020-04-20 DIAGNOSIS — I10 Essential (primary) hypertension: Secondary | ICD-10-CM

## 2020-04-20 DIAGNOSIS — I2699 Other pulmonary embolism without acute cor pulmonale: Secondary | ICD-10-CM

## 2020-04-20 DIAGNOSIS — R609 Edema, unspecified: Secondary | ICD-10-CM

## 2020-04-20 LAB — CBC WITH DIFFERENTIAL/PLATELET
Abs Immature Granulocytes: 0.22 10*3/uL — ABNORMAL HIGH (ref 0.00–0.07)
Basophils Absolute: 0.1 10*3/uL (ref 0.0–0.1)
Basophils Relative: 0 %
Eosinophils Absolute: 0 10*3/uL (ref 0.0–0.5)
Eosinophils Relative: 0 %
HCT: 49.5 % (ref 39.0–52.0)
Hemoglobin: 16.4 g/dL (ref 13.0–17.0)
Immature Granulocytes: 1 %
Lymphocytes Relative: 5 %
Lymphs Abs: 1 10*3/uL (ref 0.7–4.0)
MCH: 30.4 pg (ref 26.0–34.0)
MCHC: 33.1 g/dL (ref 30.0–36.0)
MCV: 91.8 fL (ref 80.0–100.0)
Monocytes Absolute: 0.4 10*3/uL (ref 0.1–1.0)
Monocytes Relative: 2 %
Neutro Abs: 16.8 10*3/uL — ABNORMAL HIGH (ref 1.7–7.7)
Neutrophils Relative %: 92 %
Platelets: 273 10*3/uL (ref 150–400)
RBC: 5.39 MIL/uL (ref 4.22–5.81)
RDW: 13.1 % (ref 11.5–15.5)
WBC: 18.5 10*3/uL — ABNORMAL HIGH (ref 4.0–10.5)
nRBC: 0 % (ref 0.0–0.2)

## 2020-04-20 LAB — COMPREHENSIVE METABOLIC PANEL
ALT: 26 U/L (ref 0–44)
AST: 32 U/L (ref 15–41)
Albumin: 2.3 g/dL — ABNORMAL LOW (ref 3.5–5.0)
Alkaline Phosphatase: 92 U/L (ref 38–126)
Anion gap: 14 (ref 5–15)
BUN: 42 mg/dL — ABNORMAL HIGH (ref 8–23)
CO2: 23 mmol/L (ref 22–32)
Calcium: 8.4 mg/dL — ABNORMAL LOW (ref 8.9–10.3)
Chloride: 100 mmol/L (ref 98–111)
Creatinine, Ser: 1.31 mg/dL — ABNORMAL HIGH (ref 0.61–1.24)
GFR, Estimated: 55 mL/min — ABNORMAL LOW (ref >60)
Glucose, Bld: 205 mg/dL — ABNORMAL HIGH (ref 70–99)
Potassium: 3.8 mmol/L (ref 3.5–5.1)
Sodium: 137 mmol/L (ref 135–145)
Total Bilirubin: 1.6 mg/dL — ABNORMAL HIGH (ref 0.3–1.2)
Total Protein: 7.3 g/dL (ref 6.5–8.1)

## 2020-04-20 LAB — GLUCOSE, CAPILLARY
Glucose-Capillary: 172 mg/dL — ABNORMAL HIGH (ref 70–99)
Glucose-Capillary: 179 mg/dL — ABNORMAL HIGH (ref 70–99)
Glucose-Capillary: 130 mg/dL — ABNORMAL HIGH (ref 70–99)
Glucose-Capillary: 162 mg/dL — ABNORMAL HIGH (ref 70–99)

## 2020-04-20 LAB — MAGNESIUM: Magnesium: 2.4 mg/dL (ref 1.7–2.4)

## 2020-04-20 LAB — C-REACTIVE PROTEIN: CRP: 30.9 mg/dL — ABNORMAL HIGH (ref <1.0)

## 2020-04-20 LAB — HEPARIN LEVEL (UNFRACTIONATED)
Heparin Unfractionated: 0.46 IU/mL (ref 0.30–0.70)
Heparin Unfractionated: 0.56 IU/mL (ref 0.30–0.70)

## 2020-04-20 LAB — HEMOGLOBIN A1C
Hgb A1c MFr Bld: 7.4 % — ABNORMAL HIGH (ref 4.8–5.6)
Mean Plasma Glucose: 165.68 mg/dL

## 2020-04-20 LAB — D-DIMER, QUANTITATIVE: D-Dimer, Quant: >20 ug/mL-FEU — ABNORMAL HIGH (ref 0.00–0.50)

## 2020-04-20 LAB — APTT: aPTT: 28 seconds (ref 24–36)

## 2020-04-20 LAB — PROTIME-INR
INR: 1.4 — ABNORMAL HIGH (ref 0.8–1.2)
Prothrombin Time: 16.5 seconds — ABNORMAL HIGH (ref 11.4–15.2)

## 2020-04-20 MED ORDER — METHYLPREDNISOLONE SODIUM SUCC 125 MG IJ SOLR
60.0000 mg | Freq: Two times a day (BID) | INTRAMUSCULAR | Status: DC
  Administered 2020-04-20: 06:00:00 60 mg via INTRAVENOUS
  Filled 2020-04-20: qty 2

## 2020-04-20 MED ORDER — METHYLPREDNISOLONE SODIUM SUCC 125 MG IJ SOLR
80.0000 mg | Freq: Two times a day (BID) | INTRAMUSCULAR | Status: DC
  Administered 2020-04-20 – 2020-04-22 (×4): 80 mg via INTRAVENOUS
  Filled 2020-04-20 (×4): qty 2

## 2020-04-20 MED ORDER — INSULIN ASPART 100 UNIT/ML ~~LOC~~ SOLN
0.0000 [IU] | Freq: Four times a day (QID) | SUBCUTANEOUS | Status: DC
  Administered 2020-04-20 – 2020-04-21 (×3): 3 [IU] via SUBCUTANEOUS
  Administered 2020-04-21: 23:00:00 8 [IU] via SUBCUTANEOUS
  Administered 2020-04-21 (×2): 2 [IU] via SUBCUTANEOUS
  Administered 2020-04-21 – 2020-04-22 (×3): 3 [IU] via SUBCUTANEOUS

## 2020-04-20 MED ORDER — HEPARIN (PORCINE) 25000 UT/250ML-% IV SOLN
1300.0000 [IU]/h | INTRAVENOUS | Status: DC
  Administered 2020-04-20 – 2020-04-22 (×4): 1300 [IU]/h via INTRAVENOUS
  Filled 2020-04-20 (×4): qty 250

## 2020-04-20 MED ORDER — PANTOPRAZOLE SODIUM 40 MG PO TBEC
40.0000 mg | DELAYED_RELEASE_TABLET | Freq: Two times a day (BID) | ORAL | Status: DC
  Administered 2020-04-20: 23:00:00 40 mg via ORAL
  Filled 2020-04-20: qty 1

## 2020-04-20 MED ORDER — HEPARIN BOLUS VIA INFUSION
2500.0000 [IU] | Freq: Once | INTRAVENOUS | Status: AC
  Administered 2020-04-20: 02:00:00 2500 [IU] via INTRAVENOUS
  Filled 2020-04-20: qty 2500

## 2020-04-20 NOTE — Progress Notes (Signed)
ANTICOAGULATION CONSULT NOTE - Initial Consult  Pharmacy Consult for heparin Indication: pulmonary embolus  Not on File  Patient Measurements:   Heparin Dosing Weight: 82.9kg  Vital Signs: Temp: 97.5 F (36.4 C) (12/18 1909) Temp Source: Oral (12/18 1909) BP: 146/98 (12/18 2315) Pulse Rate: 100 (12/18 2315)  Labs: Recent Labs    05/02/2020 1856 04/02/2020 2115  HGB 17.0  --   HCT 51.2  --   PLT 329  --   CREATININE 1.29*  --   TROPONINIHS  --  8    CrCl cannot be calculated (Unknown ideal weight.).   Medical History: Past Medical History:  Diagnosis Date  . Diabetes mellitus type 2 in nonobese (HCC)   . HLD (hyperlipidemia)   . Mixed hyperlipidemia due to type 2 diabetes mellitus ([place]) 04/21/2020     Assessment: 79 year old patient that was assigned male sex at birth and now identifies as male or male gender with past medical history of diabetes mellitus type 2, hyperlipidemia, hypertension, remote history of prostate cancer, obesity and remote history of gender firming surgery of the genitalia approximately 20 years ago who presents to [facility] emergency department with severe shortness of breath via EMS.  Pharmacy consulted to dose heparin for PE.  No prior AC noted  04/20/2020  Baseline aPTT, Pt/INR ordered CBC WNL Scr 1.29 DDimer>20   Goal of Therapy:  Heparin level 0.3-0.7 units/ml Monitor platelets by anticoagulation protocol:   Plan:  Heparin bolus 2500 units x 1 then Start heparin drip at 1300 units/hr Heparin level in 8 hours Daily CBC   Dolly Rias RPh 04/20/2020, 12:47 AM

## 2020-04-20 NOTE — Progress Notes (Signed)
NAME:  Cody Morgan, MRN:  [id number], DOB:  11-Jul-1940, LOS: 1 ADMISSION DATE:  04/25/2020, CONSULTATION DATE:  12/18 REFERRING MD:  Vianne Bulls, CHIEF COMPLAINT:  Pneumothorax, respiratory failure  Brief History:  79 yo male never smoker non vaccinated onset sympotms around 10 d p  Thanksgiving but rapidly declining 48 h PTA  dx covid pneumonia, acute R pneumothorax  PPCM asked to consult 12/18   History of Present Illness:  Cody Morgan is an 79 y/o Cody Morgan with history of DM who presents to the ED with shortness of breath.  When EMS arrived he had saturations in the 70s on room air.  In the ED he remained in the mid 80s on high flow cannula and nonrebreather.  He has been intermittently confused giving limited history.  He has been feeling poorly for several weeks.  Found to have Covid pneumonia and large R thorax.  His wife passed away several days PTA  from Covid pneumonia.  H .  Since chest tube has been placed in the ED his saturations have been in the 90s and his encephalopathy  improved.  During his exam he denied acute complaints but provides limited additional history.  Past Medical History:  DM2 HLD  Significant Hospital Events:   Consults:  PCCM  Procedures:  Pigtail chest tube 12/18   Significant Diagnostic Tests:  CTa 12/18  Segmental PE on R s strain Venous dopplers 12/19 >>>  Micro Data:  Resp viral PCR >  Neg flu/  POS COVID 19 Blood cx 12/18>  Antimicrobials/covid rx :  remdesivir  12/18 Dexamethasone  12/18  Baricitinib  12/19   Interim History / Subjective:  Says "just wan to go back to normal"  Denies sob/ cp   Objective   Blood pressure (!) 109/56, pulse 83, temperature 98.8 F (37.1 C), temperature source Axillary, resp. rate (!) 24, height 5\' 10"  (1.778 m), weight 82.9 kg, SpO2 90 %.    FiO2 (%):  [100 %] 100 %   Intake/Output Summary (Last 24 hours) at 04/20/2020 0724 Last data filed at 04/20/2020 0700 Gross per 24 hour  Intake 1236.75 ml   Output 20 ml  Net 1216.75 ml   Filed Weights   04/20/20 0030 04/20/20 0110  Weight: 82.9 kg 82.9 kg    Examination: General:  More chronically than acutely ill apearing elderly wm nad Tmax  98.8  Pt sitting up at 45 degrees on NRM/ HFN 02  - R chest tube minimal fluctuation/ no bubbling  No jvd Neck supple Lungs with a few scattered exp > insp rhonchi bilaterally RRR no s3 or or sign murmur Abd obese with nl excursion  Extr warm with no edema or clubbing noted   I personally reviewed images and agree with radiology impression as follows:  CXR:   Portable 12/19 1. The small right pneumothorax is smaller in the interval. The right chest tube is stable. 2. Stable bilateral pulmonary infiltrates consistent with COVID-19 pneumonia.      Resolved Hospital Problem list    Assessment & Plan:  Right spontaneous pneumothorax secondary to Covid pneumonia -Chest tube to suction, -20 cm water > continue another 24 h -Flush chest tube per pigtail catheter protocol, every 8 hours -Norco as needed for chest tube related pain  Acute hypoxic respiratory failure due to COVID-19 viral pneumonia -Supplemental oxygen as required to maintain sats greater than mid 80s. -Awake proning as tol -Out of bed mobility, incentive spirometry, flutter valve -Remdesivir, dexamethasone -add baricitinib  12/19    PUlmonary embolism / R segmental s RV strain -Leg doppler US  12/19  - empiric therapeutic anticoagulation    AKI  Lab Results  Component Value Date   CREATININE 1.31 (H) 04/20/2020   CREATININE 1.29 (H) 04/12/2020   >>> s/p contrast 12/18, keep hydrated      Best practice (evaluated daily)  Per primary    Labs   CBC: Recent Labs  Lab 04/14/2020 1856 04/20/20 0109  WBC 19.7* 18.5*  NEUTROABS 16.9* 16.8*  HGB 17.0 16.4  HCT 51.2 49.5  MCV 91.4 91.8  PLT 329 220    Basic Metabolic Panel: Recent Labs  Lab 04/11/2020 1856 04/20/20 0109  NA 137 137  K 3.9 3.8  CL 99  100  CO2 19* 23  GLUCOSE 158* 205*  BUN 41* 42*  CREATININE 1.29* 1.31*  CALCIUM 8.5* 8.4*  MG  --  2.4   GFR: Estimated Creatinine Clearance (by C-G formula based on SCr of 1.31 mg/dL (H)) Male: 40.2 mL/min (A) Male: 46.4 mL/min (A) Recent Labs  Lab 04/17/2020 1856 04/20/20 0109  PROCALCITON 0.45  --   WBC 19.7* 18.5*    Liver Function Tests: Recent Labs  Lab 04/20/2020 1856 04/20/20 0109  AST 28 32  ALT 23 26  ALKPHOS 96 92  BILITOT 1.7* 1.6*  PROT 7.9 7.3  ALBUMIN 2.6* 2.3*   No results for input(s): LIPASE, AMYLASE in the last 168 hours. No results for input(s): AMMONIA in the last 168 hours.  ABG    Component Value Date/Time   PHART 7.454 (H) 04/27/2020 2138   PCO2ART 30.1 (L) 04/14/2020 2138   PO2ART 58.8 (L) 04/15/2020 2138   HCO3 20.8 04/26/2020 2138   ACIDBASEDEF 1.5 04/07/2020 2138   O2SAT 87.5 04/20/2020 2138     Coagulation Profile: Recent Labs  Lab 04/20/20 0109  INR 1.4*    Cardiac Enzymes: No results for input(s): CKTOTAL, CKMB, CKMBINDEX, TROPONINI in the last 168 hours.  HbA1C: Hgb A1c MFr Bld  Date/Time Value Ref Range Status  04/16/2020 07:09 PM 7.4 (H) 4.8 - 5.6 % Final    Comment:    (NOTE) Pre diabetes:          5.7%-6.4%  Diabetes:              >6.4%  Glycemic control for   <7.0% adults with diabetes     CBG: Recent Labs  Lab 04/20/20 0430  GLUCAP 172*      The patient is critically ill with multiple organ systems failure and requires high complexity decision making for assessment and support, frequent evaluation and titration of therapies, application of advanced monitoring technologies and extensive interpretation of multiple databases. Critical Care Time devoted to patient care services described in this note is 35 minutes.    Christinia Gully, MD [facility] [phone]   After 7:00 pm call Elink  [phone]

## 2020-04-20 NOTE — Progress Notes (Signed)
ANTICOAGULATION CONSULT NOTE  Pharmacy Consult for heparin Indication: pulmonary embolus  No Known Allergies  Patient Measurements: Height: 5\' 10"  (177.8 cm) Weight: 82.9 kg (182 lb 12.2 oz) IBW/kg (Calculated) : 73 Heparin Dosing Weight: 82.9kg  Vital Signs: Temp: 98.8 F (37.1 C) (12/19 0930) Temp Source: Axillary (12/19 0930) BP: 109/56 (12/19 0400) Pulse Rate: 83 (12/19 0400)  Labs: Recent Labs    04/10/2020 1856 05/02/2020 2115 04/20/20 0109 04/20/20 0949  HGB 17.0  --  16.4  --   HCT 51.2  --  49.5  --   PLT 329  --  273  --   APTT  --   --  28  --   LABPROT  --   --  16.5*  --   INR  --   --  1.4*  --   HEPARINUNFRC  --   --   --  0.46  CREATININE 1.29*  --  1.31*  --   TROPONINIHS  --  8  --   --     Estimated Creatinine Clearance (by C-G formula based on SCr of 1.31 mg/dL (H)) Male: 40.2 mL/min (A) Male: 46.4 mL/min (A)   Medical History: Past Medical History:  Diagnosis Date  . Diabetes mellitus type 2 in nonobese (HCC)   . HLD (hyperlipidemia)   . Mixed hyperlipidemia due to type 2 diabetes mellitus ([place]) 04/03/2020     Assessment: 79 year old patient that was assigned male sex at birth and now identifies as male or male gender with past medical history of diabetes mellitus type 2, hyperlipidemia, hypertension, remote history of prostate cancer, obesity and remote history of gender firming surgery of the genitalia approximately 20 years ago who presents to [facility] emergency department with severe shortness of breath via EMS.  Pharmacy consulted to dose heparin for PE.  No prior AC noted  04/20/2020   HL 0.46 at goal  No bleeding or infusion issues reported by RN  Goal of Therapy:  Heparin level 0.3-0.7 units/ml Monitor platelets by anticoagulation protocol:   Plan:  Continue heparin infusion at 1300 units/hr Check a confirmatory heparin level in 8 hours Daily CBC  Ulice Dash D  04/20/2020, 10:31 AM

## 2020-04-20 NOTE — Progress Notes (Signed)
Pharmacy: Re-Heparin  Patient presented to the ED on 12/18 with c/o SOB.  Respiratory panel came back positive for COVID-19. Chest CTA showed small right basilar segmental branch PE (no evidence of right heart strain) and small right pneumothorax. Chest tube placed in the ED on 12/18.  LE doppler on 12/19 was negative for DVT. Pt's currently on heparin drip for acute PE.  - confirmatory heparin level collected at 6:30p is therapeutic at 0.56  Goal of Therapy:  Heparin level 0.3-0.7 units/ml Monitor platelets by anticoagulation protocol: yes  Plan: - continue heparin drip at 1300 units/hr - daily heparin level  - monitor for s/sx bleeding  Cody Morgan, PharmD, BCPS 04/20/2020 7:02 PM

## 2020-04-20 NOTE — Plan of Care (Signed)

## 2020-04-20 NOTE — Progress Notes (Signed)
PROGRESS NOTE  Cody Morgan PXT:[id number] DOB: [birth year] DOA: 04/12/2020 PCP: Pcp, No   LOS: 1 day   Brief Narrative / Interim history: 79 year old male with history of genital reassignment 20 years ago who identifies as male, HTN, DM 2, HLD, prostate cancer, obesity, came to the hospital with shortness of breath.  Patient and his wife began developing shortness of breath a week and a half after Thanksgiving, symptoms were initially mild but then progressed. His wife and about being hospitalized and passed away on May 04, 2023 due to Covid related complications.  Per daughter in the past 48 hours he developed rapidly worsening shortness of breath.  In the ED he was found to have a pneumothorax on the right side status post chest tube placement.  PCCM also consulted.  CT angiogram also showed PE  Subjective / 24h Interval events: Keeps his eyes closed, he tells me he feels "okay".  Does not contribute much to the story and seems withdrawn  Assessment & Plan:  Principal Problem Acute Hypoxic Respiratory Failure due to Covid-19 Viral Illness -Patient remains profoundly hypoxic this morning requiring high flow oxygen -Started on baricitinib, steroids, remdesivir, continue -Very poor prognosis at this point, symptoms have been going on for several weeks now  FiO2 (%):  [100 %] 100 %    COVID-19 Labs  Recent Labs    05/01/2020 1856 04/20/20 0109  DDIMER >20.00* >20.00*  FERRITIN 1,054*  --   LDH 411*  --   CRP 31.4* 30.9*    Lab Results  Component Value Date   SARSCOV2NAA POSITIVE (A) 04/15/2020   Active Problems Right-sided pneumothorax -Status post chest tube placement by the EDP, PCCM consulted and following.  Appreciate input.  Acute PE -Seen on CT angiogram, he has a small right basilar segmental branch pulmonary embolus without evidence of right heart strain -Started on heparin, continue.  No evidence of bleeding  Status post gender reassignment surgery -Noted  Essential  hypertension -Currently normotensive, monitor  Elevated bilirubin -Minimal, continue to monitor, AST and ALT normal  Probable CKD 3A -No prior baseline creatinine, currently around 1.3 and stable -Avoid nephrotoxins  Type 2 diabetes mellitus -Continue Lantus, sliding scale  CBG (last 3)  Recent Labs    04/20/20 0430  GLUCAP 172*     Scheduled Meds: . vitamin C  500 mg Oral Daily  . baricitinib  2 mg Oral Daily  . chlorhexidine  15 mL Mouth Rinse BID  . Chlorhexidine Gluconate Cloth  6 each Topical Q0600  . dexamethasone (DECADRON) injection  6 mg Intravenous Q24H  . insulin aspart  0-15 Units Subcutaneous Q6H  . insulin glargine  10 Units Subcutaneous Daily  . LORazepam  2 mg Intravenous Once  . mouth rinse  15 mL Mouth Rinse BID  . sodium chloride flush  10 mL Intracatheter Q8H  . zinc sulfate  220 mg Oral Daily   Continuous Infusions: . heparin 1,300 Units/hr (04/20/20 0135)  . lactated ringers 100 mL/hr at 04/20/20 0049  . remdesivir 100 mg in NS 100 mL     PRN Meds:.acetaminophen, albuterol, guaiFENesin-dextromethorphan, HYDROcodone-acetaminophen **OR** morphine injection, ondansetron **OR** ondansetron (ZOFRAN) IV, polyethylene glycol  DVT prophylaxis: heparin infusion Code Status: Full code Family Communication: daughter [name] over the phone  Status is: Inpatient  Remains inpatient appropriate because:Inpatient level of care appropriate due to severity of illness  Dispo: The patient is from: Home              Anticipated d/c is to:  Home              Anticipated d/c date is: > 3 days              Patient currently is not medically stable to d/c.   Consultants:  PCCM  Procedures:  Chest tube placement 12/18   Microbiology: none  Antibacterials: none   Objective: Vitals:   04/20/20 0101 04/20/20 0110 04/20/20 0300 04/20/20 0400  BP: 112/67  125/67 (!) 109/56  Pulse: 96  87 83  Resp: (!) 33  (!) 35 (!) 24  Temp:      TempSrc:      SpO2:  (!) 87%  (!) 87% 90%  Weight:  82.9 kg    Height:        Intake/Output Summary (Last 24 hours) at 04/20/2020 0535 Last data filed at 04/20/2020 0300 Gross per 24 hour  Intake 1236.75 ml  Output --  Net 1236.75 ml   Filed Weights   04/20/20 0030 04/20/20 0110  Weight: 82.9 kg 82.9 kg    Examination:  Constitutional: NAD Eyes: no scleral icterus ENMT: Mucous membranes are moist.  Neck: normal, supple Respiratory: Diminished bilaterally, tachypneic, no wheezing heard.  Moves air bilaterally. Cardiovascular: Regular rate and rhythm, no murmurs / rubs / gallops. No LE edema.  Abdomen: non distended, no tenderness. Bowel sounds positive.  Musculoskeletal: no clubbing / cyanosis.  Skin: no rashes Neurologic: Does not follow commands consistently but overall nonfocal  Data Reviewed: I have independently reviewed following labs and imaging studies   CBC: Recent Labs  Lab 04/18/2020 1856 04/20/20 0109  WBC 19.7* 18.5*  NEUTROABS 16.9* 16.8*  HGB 17.0 16.4  HCT 51.2 49.5  MCV 91.4 91.8  PLT 329 401   Basic Metabolic Panel: Recent Labs  Lab 05/01/2020 1856 04/20/20 0109  NA 137 137  K 3.9 3.8  CL 99 100  CO2 19* 23  GLUCOSE 158* 205*  BUN 41* 42*  CREATININE 1.29* 1.31*  CALCIUM 8.5* 8.4*  MG  --  2.4   GFR: Estimated Creatinine Clearance (by C-G formula based on SCr of 1.31 mg/dL (H)) Male: 40.2 mL/min (A) Male: 46.4 mL/min (A) Liver Function Tests: Recent Labs  Lab 05/01/2020 1856 04/20/20 0109  AST 28 32  ALT 23 26  ALKPHOS 96 92  BILITOT 1.7* 1.6*  PROT 7.9 7.3  ALBUMIN 2.6* 2.3*   No results for input(s): LIPASE, AMYLASE in the last 168 hours. No results for input(s): AMMONIA in the last 168 hours. Coagulation Profile: Recent Labs  Lab 04/20/20 0109  INR 1.4*   Cardiac Enzymes: No results for input(s): CKTOTAL, CKMB, CKMBINDEX, TROPONINI in the last 168 hours. BNP (last 3 results) No results for input(s): PROBNP in the last 8760  hours. HbA1C: Recent Labs    04/11/2020 1909  HGBA1C 7.4*   CBG: Recent Labs  Lab 04/20/20 0430  GLUCAP 172*   Lipid Profile: No results for input(s): CHOL, HDL, LDLCALC, TRIG, CHOLHDL, LDLDIRECT in the last 72 hours. Thyroid Function Tests: No results for input(s): TSH, T4TOTAL, FREET4, T3FREE, THYROIDAB in the last 72 hours. Anemia Panel: Recent Labs    04/06/2020 1856  FERRITIN 1,054*   Urine analysis: No results found for: COLORURINE, APPEARANCEUR, LABSPEC, PHURINE, GLUCOSEU, HGBUR, BILIRUBINUR, KETONESUR, PROTEINUR, UROBILINOGEN, NITRITE, LEUKOCYTESUR Sepsis Labs: Invalid input(s): PROCALCITONIN, LACTICIDVEN  Recent Results (from the past 240 hour(s))  Resp Panel by RT-PCR (Flu A&B, Covid) Nasopharyngeal Swab     Status: Abnormal   Collection Time:  04/15/2020  6:59 PM   Specimen: Nasopharyngeal Swab; Nasopharyngeal(NP) swabs in vial transport medium  Result Value Ref Range Status   SARS Coronavirus 2 by RT PCR POSITIVE (A) NEGATIVE Final    Comment: CRITICAL RESULT CALLED TO, READ BACK BY AND VERIFIED WITH: CHRIS PARKER RN 04/24/2020 @2001  BY P. HNEDERSON (NOTE) SARS-CoV-2 target nucleic acids are DETECTED.  The SARS-CoV-2 RNA is generally detectable in upper respiratory specimens during the acute phase of infection. Positive results are indicative of the presence of the identified virus, but do not rule out bacterial infection or co-infection with other pathogens not detected by the test. Clinical correlation with patient history and other diagnostic information is necessary to determine patient infection status. The expected result is Negative.  Fact Sheet for Patients: EntrepreneurPulse.com.au  Fact Sheet for Healthcare Providers: IncredibleEmployment.be  This test is not yet approved or cleared by the [place] FDA and  has been authorized for detection and/or diagnosis of SARS-CoV-2 by FDA under an Emergency Use  Authorization (EUA).  This EUA will remain in effect ( meaning this test can be used) for the duration of  the COVID-19 declaration under Section 564(b)(1) of the Act, 21 U.S.C. section 360bbb-3(b)(1), unless the authorization is terminated or revoked sooner.     Influenza A by PCR NEGATIVE NEGATIVE Final   Influenza B by PCR NEGATIVE NEGATIVE Final    Comment: (NOTE) The Xpert Xpress SARS-CoV-2/FLU/RSV plus assay is intended as an aid in the diagnosis of influenza from Nasopharyngeal swab specimens and should not be used as a sole basis for treatment. Nasal washings and aspirates are unacceptable for Xpert Xpress SARS-CoV-2/FLU/RSV testing.  Fact Sheet for Patients: EntrepreneurPulse.com.au  Fact Sheet for Healthcare Providers: IncredibleEmployment.be  This test is not yet approved or cleared by the [place] FDA and has been authorized for detection and/or diagnosis of SARS-CoV-2 by FDA under an Emergency Use Authorization (EUA). This EUA will remain in effect (meaning this test can be used) for the duration of the COVID-19 declaration under Section 564(b)(1) of the Act, 21 U.S.C. section 360bbb-3(b)(1), unless the authorization is terminated or revoked.  Performed at [facility], [place] [address], [place], [place] [address]       Radiology Studies: DG Chest 1 View  Result Date: 04/04/2020 CLINICAL DATA:  Right-sided chest tube placement, pneumothorax EXAM: CHEST  1 VIEW COMPARISON:  04/12/2020 FINDINGS: Single frontal view of the chest demonstrates right-sided pigtail drainage catheter coiled over the medial right lung base. Near complete resolution of right pneumothorax, with less than 10% residual right apical pneumothorax identified. Widespread bilateral airspace disease consistent with known history of COVID-19 pneumonia. No effusion. Cardiac silhouette obscured by lung consolidation. IMPRESSION: 1. Near complete  resolution of right pneumothorax after pigtail drainage catheter placement. 2. Widespread bilateral airspace disease consistent with known COVID-19 pneumonia. Electronically Signed   By: Randa Ngo M.D.   On: 04/15/2020 21:21   CT Angio Chest PE W/Cm &/Or Wo Cm  Addendum Date: 04/20/2020   ADDENDUM REPORT: 04/20/2020 00:38 ADDENDUM: Critical Value/emergent results were called by telephone at the time of interpretation on 04/20/2020 at 12:38 am to provider Inda Merlin , who verbally acknowledged these results. Electronically Signed   By: Ulyses Jarred M.D.   On: 04/20/2020 00:38   Result Date: 04/20/2020 CLINICAL DATA:  Shortness of breath.  COVID-19. EXAM: CT ANGIOGRAPHY CHEST WITH CONTRAST TECHNIQUE: Multidetector CT imaging of the chest was performed using the standard protocol during bolus administration of intravenous contrast.  Multiplanar CT image reconstructions and MIPs were obtained to evaluate the vascular anatomy. CONTRAST:  124mL OMNIPAQUE IOHEXOL 350 MG/ML SOLN COMPARISON:  None. FINDINGS: Cardiovascular: Satisfactory contrast bolus. Imaging degraded by motion. Small filling defect in a right basilar segmental branch (series 5, image 131). The size of the main pulmonary artery is normal. Heart size is normal, with no pericardial effusion. The course and caliber of the aorta are normal. There is atherosclerotic calcification. No acute aortic syndrome. Mediastinum/Nodes: No mediastinal, hilar or axillary lymphadenopathy. Normal visualized thyroid. Thoracic esophageal course is normal. Lungs/Pleura: Multifocal, peripheral predominant ground glass opacities. No pleural effusion. Small right pneumothorax. Chest tube is near the right lung apex. Upper Abdomen: Contrast bolus timing is not optimized for evaluation of the abdominal organs. The visualized portions of the organs of the upper abdomen are normal. Musculoskeletal: No chest wall abnormality. No bony spinal canal stenosis. Review of the  MIP images confirms the above findings. IMPRESSION: 1. Motion degraded study. 2. Small right basilar segmental branch pulmonary embolus. No CT evidence of right heart strain. 3. Small right pneumothorax. Chest tube is near the right lung apex. 4. COVID-19 pattern pneumonia. Aortic Atherosclerosis (ICD10-I70.0). Electronically Signed: By: Ulyses Jarred M.D. On: 04/20/2020 00:33   DG Chest Port 1 View  Result Date: 04/12/2020 CLINICAL DATA:  Short of breath for several days, hypoxia EXAM: PORTABLE CHEST 1 VIEW COMPARISON:  None. FINDINGS: Single frontal view of the chest demonstrates large right-sided pneumothorax volume estimated greater than 50%. No evidence of mediastinal shift or tension effect. There is widespread bilateral airspace disease which may reflect multifocal pneumonia or edema. No pleural effusion. Cardiac silhouette is obscured by the lung consolidation. No acute bony abnormalities. IMPRESSION: 1. Large right pneumothorax, volume estimated greater than 50%. No tension effect or midline shift. 2. Widespread bilateral airspace disease which may reflect multifocal infection or edema. Critical Value/emergent results were called by telephone at the time of interpretation on 04/26/2020 at 8:12 pm to provider [facility] , who verbally acknowledged these results. Electronically Signed   By: Randa Ngo M.D.   On: 04/23/2020 20:14   Marzetta Board, MD, PhD [facility]  Between 7 am - 7 pm I am available, please contact me via Amion or Securechat  Between 7 pm - 7 am I am not available, please contact night coverage MD/APP via Amion

## 2020-04-20 NOTE — Progress Notes (Signed)
Bilateral lower extremity venous duplex has been completed. Preliminary results can be found in CV Proc through chart review.   04/20/20 2:52 PM Cody Morgan RVT

## 2020-04-21 ENCOUNTER — Inpatient Hospital Stay (HOSPITAL_COMMUNITY): Payer: PPO

## 2020-04-21 DIAGNOSIS — I2699 Other pulmonary embolism without acute cor pulmonale: Secondary | ICD-10-CM

## 2020-04-21 DIAGNOSIS — J80 Acute respiratory distress syndrome: Secondary | ICD-10-CM

## 2020-04-21 LAB — BLOOD GAS, ARTERIAL
Acid-base deficit: 5 mmol/L — ABNORMAL HIGH (ref 0.0–2.0)
Bicarbonate: 23.2 mmol/L (ref 20.0–28.0)
Drawn by: 30136
FIO2: 100
MECHVT: 430 mL
O2 Saturation: 81 %
PEEP: 16 cmH2O
Patient temperature: 98.6
RATE: 28 resp/min
pCO2 arterial: 56.7 mmHg — ABNORMAL HIGH (ref 32.0–48.0)
pH, Arterial: 7.236 — ABNORMAL LOW (ref 7.350–7.450)
pO2, Arterial: 60.8 mmHg — ABNORMAL LOW (ref 83.0–108.0)

## 2020-04-21 LAB — MAGNESIUM
Magnesium: 2.5 mg/dL — ABNORMAL HIGH (ref 1.7–2.4)
Magnesium: 2.7 mg/dL — ABNORMAL HIGH (ref 1.7–2.4)

## 2020-04-21 LAB — CBC WITH DIFFERENTIAL/PLATELET
Abs Immature Granulocytes: 0.13 10*3/uL — ABNORMAL HIGH (ref 0.00–0.07)
Basophils Absolute: 0 10*3/uL (ref 0.0–0.1)
Basophils Relative: 0 %
Eosinophils Absolute: 0 10*3/uL (ref 0.0–0.5)
Eosinophils Relative: 0 %
HCT: 44.5 % (ref 39.0–52.0)
Hemoglobin: 14.9 g/dL (ref 13.0–17.0)
Immature Granulocytes: 1 %
Lymphocytes Relative: 11 %
Lymphs Abs: 1.5 10*3/uL (ref 0.7–4.0)
MCH: 30.7 pg (ref 26.0–34.0)
MCHC: 33.5 g/dL (ref 30.0–36.0)
MCV: 91.6 fL (ref 80.0–100.0)
Monocytes Absolute: 0.4 10*3/uL (ref 0.1–1.0)
Monocytes Relative: 3 %
Neutro Abs: 12.2 10*3/uL — ABNORMAL HIGH (ref 1.7–7.7)
Neutrophils Relative %: 85 %
Platelets: 251 10*3/uL (ref 150–400)
RBC: 4.86 MIL/uL (ref 4.22–5.81)
RDW: 12.9 % (ref 11.5–15.5)
WBC: 14.3 10*3/uL — ABNORMAL HIGH (ref 4.0–10.5)
nRBC: 0 % (ref 0.0–0.2)

## 2020-04-21 LAB — COMPREHENSIVE METABOLIC PANEL
ALT: 25 U/L (ref 0–44)
AST: 33 U/L (ref 15–41)
Albumin: 2.1 g/dL — ABNORMAL LOW (ref 3.5–5.0)
Alkaline Phosphatase: 85 U/L (ref 38–126)
Anion gap: 12 (ref 5–15)
BUN: 35 mg/dL — ABNORMAL HIGH (ref 8–23)
CO2: 23 mmol/L (ref 22–32)
Calcium: 8.1 mg/dL — ABNORMAL LOW (ref 8.9–10.3)
Chloride: 105 mmol/L (ref 98–111)
Creatinine, Ser: 0.94 mg/dL (ref 0.61–1.24)
GFR, Estimated: >60 mL/min (ref >60)
Glucose, Bld: 184 mg/dL — ABNORMAL HIGH (ref 70–99)
Potassium: 3.8 mmol/L (ref 3.5–5.1)
Sodium: 140 mmol/L (ref 135–145)
Total Bilirubin: 0.8 mg/dL (ref 0.3–1.2)
Total Protein: 6.6 g/dL (ref 6.5–8.1)

## 2020-04-21 LAB — GLUCOSE, CAPILLARY
Glucose-Capillary: 143 mg/dL — ABNORMAL HIGH (ref 70–99)
Glucose-Capillary: 153 mg/dL — ABNORMAL HIGH (ref 70–99)
Glucose-Capillary: 149 mg/dL — ABNORMAL HIGH (ref 70–99)
Glucose-Capillary: 186 mg/dL — ABNORMAL HIGH (ref 70–99)
Glucose-Capillary: 253 mg/dL — ABNORMAL HIGH (ref 70–99)

## 2020-04-21 LAB — C-REACTIVE PROTEIN: CRP: 21.3 mg/dL — ABNORMAL HIGH (ref <1.0)

## 2020-04-21 LAB — D-DIMER, QUANTITATIVE: D-Dimer, Quant: 10.2 ug/mL-FEU — ABNORMAL HIGH (ref 0.00–0.50)

## 2020-04-21 LAB — HEPARIN LEVEL (UNFRACTIONATED): Heparin Unfractionated: 0.54 IU/mL (ref 0.30–0.70)

## 2020-04-21 LAB — PHOSPHORUS
Phosphorus: 3.7 mg/dL (ref 2.5–4.6)
Phosphorus: 6.8 mg/dL — ABNORMAL HIGH (ref 2.5–4.6)

## 2020-04-21 MED ORDER — VECURONIUM BROMIDE 10 MG IV SOLR
0.0000 ug/kg/min | Status: DC
  Administered 2020-04-21: 16:00:00 1 ug/kg/min via INTRAVENOUS
  Administered 2020-04-22: 08:00:00 0.5 ug/kg/min via INTRAVENOUS
  Filled 2020-04-21 (×3): qty 100

## 2020-04-21 MED ORDER — MIDAZOLAM HCL 2 MG/2ML IJ SOLN: 1.0000 mg | Freq: Once | INTRAMUSCULAR | Status: AC

## 2020-04-21 MED ORDER — NOREPINEPHRINE 16 MG/250ML-% IV SOLN
0.0000 ug/min | INTRAVENOUS | Status: DC
  Administered 2020-04-21: 18:00:00 2 ug/min via INTRAVENOUS
  Filled 2020-04-21: qty 250

## 2020-04-21 MED ORDER — PROSOURCE TF PO LIQD
45.0000 mL | Freq: Every day | ORAL | Status: DC
  Administered 2020-04-21 (×2): 45 mL
  Filled 2020-04-21 (×2): qty 45

## 2020-04-21 MED ORDER — FENTANYL CITRATE (PF) 100 MCG/2ML IJ SOLN: 100.0000 ug | Freq: Once | INTRAMUSCULAR | Status: AC

## 2020-04-21 MED ORDER — ORAL CARE MOUTH RINSE
15.0000 mL | OROMUCOSAL | Status: DC
  Administered 2020-04-21 – 2020-04-22 (×12): 15 mL via OROMUCOSAL

## 2020-04-21 MED ORDER — PROPOFOL 500 MG/50ML IV EMUL
INTRAVENOUS | Status: AC
  Administered 2020-04-21: 11:00:00 5 ug/kg/min via INTRAVENOUS
  Filled 2020-04-21: qty 50

## 2020-04-21 MED ORDER — VITAL 1.5 CAL PO LIQD
1000.0000 mL | ORAL | Status: DC
  Administered 2020-04-21: 23:00:00 1000 mL
  Filled 2020-04-21: qty 1000

## 2020-04-21 MED ORDER — VECURONIUM BROMIDE 10 MG IV SOLR
INTRAVENOUS | Status: AC
  Administered 2020-04-21: 13:00:00 8.3 mg via INTRAVENOUS
  Filled 2020-04-21: qty 10

## 2020-04-21 MED ORDER — POLYETHYLENE GLYCOL 3350 17 G PO PACK
17.0000 g | PACK | Freq: Every day | ORAL | Status: DC
  Administered 2020-04-21: 12:00:00 17 g
  Filled 2020-04-21: qty 1

## 2020-04-21 MED ORDER — ARTIFICIAL TEARS OPHTHALMIC OINT
1.0000 "application " | TOPICAL_OINTMENT | Freq: Three times a day (TID) | OPHTHALMIC | Status: DC
  Administered 2020-04-21 – 2020-04-22 (×3): 1 via OPHTHALMIC
  Filled 2020-04-21: qty 3.5

## 2020-04-21 MED ORDER — PHENYLEPHRINE 40 MCG/ML (10ML) SYRINGE FOR IV PUSH (FOR BLOOD PRESSURE SUPPORT): 160.0000 ug | PREFILLED_SYRINGE | Freq: Once | INTRAVENOUS | Status: AC

## 2020-04-21 MED ORDER — LACTATED RINGERS IV BOLUS
500.0000 mL | Freq: Once | INTRAVENOUS | Status: AC
  Administered 2020-04-21: 06:00:00 500 mL via INTRAVENOUS

## 2020-04-21 MED ORDER — CHLORHEXIDINE GLUCONATE 0.12% ORAL RINSE (MEDLINE KIT)
15.0000 mL | Freq: Two times a day (BID) | OROMUCOSAL | Status: DC
  Administered 2020-04-21 – 2020-04-22 (×3): 15 mL via OROMUCOSAL

## 2020-04-21 MED ORDER — SUCCINYLCHOLINE CHLORIDE 200 MG/10ML IV SOSY
PREFILLED_SYRINGE | INTRAVENOUS | Status: AC
  Filled 2020-04-21: qty 10

## 2020-04-21 MED ORDER — SODIUM CHLORIDE 0.9% FLUSH: 10.0000 mL | INTRAVENOUS | Status: DC | PRN

## 2020-04-21 MED ORDER — PANTOPRAZOLE SODIUM 40 MG IV SOLR
40.0000 mg | INTRAVENOUS | Status: DC
  Administered 2020-04-21: 18:00:00 40 mg via INTRAVENOUS
  Filled 2020-04-21: qty 40

## 2020-04-21 MED ORDER — FENTANYL 2500MCG IN NS 250ML (10MCG/ML) PREMIX INFUSION
25.0000 ug/h | INTRAVENOUS | Status: DC
  Administered 2020-04-21: 12:00:00 25 ug/h via INTRAVENOUS
  Filled 2020-04-21: qty 250

## 2020-04-21 MED ORDER — ETOMIDATE 2 MG/ML IV SOLN
INTRAVENOUS | Status: AC
  Administered 2020-04-21: 11:00:00 20 mg via INTRAVENOUS
  Filled 2020-04-21: qty 20

## 2020-04-21 MED ORDER — PHENYLEPHRINE 40 MCG/ML (10ML) SYRINGE FOR IV PUSH (FOR BLOOD PRESSURE SUPPORT)
PREFILLED_SYRINGE | INTRAVENOUS | Status: AC
  Administered 2020-04-21: 11:00:00 160 ug via INTRAVENOUS
  Filled 2020-04-21: qty 10

## 2020-04-21 MED ORDER — VECURONIUM BROMIDE 10 MG IV SOLR
INTRAVENOUS | Status: AC
  Filled 2020-04-21: qty 10

## 2020-04-21 MED ORDER — VITAL HIGH PROTEIN PO LIQD
1000.0000 mL | ORAL | Status: AC
  Administered 2020-04-21: 18:00:00 1000 mL

## 2020-04-21 MED ORDER — FENTANYL BOLUS VIA INFUSION
25.0000 ug | INTRAVENOUS | Status: DC | PRN
  Filled 2020-04-21: qty 25

## 2020-04-21 MED ORDER — ROCURONIUM BROMIDE 10 MG/ML (PF) SYRINGE
PREFILLED_SYRINGE | INTRAVENOUS | Status: AC
  Administered 2020-04-21: 11:00:00 60 mg
  Filled 2020-04-21: qty 10

## 2020-04-21 MED ORDER — STERILE WATER FOR INJECTION IJ SOLN
INTRAMUSCULAR | Status: AC
  Filled 2020-04-21: qty 10

## 2020-04-21 MED ORDER — MIDAZOLAM HCL 2 MG/2ML IJ SOLN
INTRAMUSCULAR | Status: AC
  Administered 2020-04-21: 11:00:00 1 mg via INTRAVENOUS
  Filled 2020-04-21: qty 4

## 2020-04-21 MED ORDER — ALBUMIN HUMAN 25 % IV SOLN
12.5000 g | Freq: Once | INTRAVENOUS | Status: AC
  Administered 2020-04-21: 23:00:00 12.5 g via INTRAVENOUS
  Filled 2020-04-21: qty 50

## 2020-04-21 MED ORDER — ARTIFICIAL TEARS OPHTHALMIC OINT
1.0000 "application " | TOPICAL_OINTMENT | Freq: Three times a day (TID) | OPHTHALMIC | Status: DC
  Administered 2020-04-21: 1 via OPHTHALMIC
  Filled 2020-04-21: qty 3.5

## 2020-04-21 MED ORDER — ETOMIDATE 2 MG/ML IV SOLN: 20.0000 mg | Freq: Once | INTRAVENOUS | Status: AC

## 2020-04-21 MED ORDER — FREE WATER
200.0000 mL | Freq: Four times a day (QID) | Status: DC
Start: 2020-04-21 — End: 2020-04-21
  Administered 2020-04-21 (×2): 200 mL

## 2020-04-21 MED ORDER — DOCUSATE SODIUM 50 MG/5ML PO LIQD
100.0000 mg | Freq: Two times a day (BID) | ORAL | Status: DC
  Administered 2020-04-21 (×2): 100 mg
  Filled 2020-04-21 (×2): qty 10

## 2020-04-21 MED ORDER — FENTANYL CITRATE (PF) 100 MCG/2ML IJ SOLN
25.0000 ug | Freq: Once | INTRAMUSCULAR | Status: DC
Start: 2020-04-21 — End: 2020-04-23

## 2020-04-21 MED ORDER — VECURONIUM BROMIDE 10 MG IV SOLR: 0.1000 mg/kg | INTRAVENOUS | Status: DC | PRN

## 2020-04-21 MED ORDER — ROCURONIUM BROMIDE 50 MG/5ML IV SOLN
60.0000 mg | Freq: Once | INTRAVENOUS | Status: AC
  Filled 2020-04-21: qty 6

## 2020-04-21 MED ORDER — SODIUM CHLORIDE 0.9% FLUSH
10.0000 mL | Freq: Two times a day (BID) | INTRAVENOUS | Status: DC
  Administered 2020-04-21 (×2): 10 mL

## 2020-04-21 MED ORDER — PROSOURCE TF PO LIQD
45.0000 mL | Freq: Two times a day (BID) | ORAL | Status: DC
  Administered 2020-04-21: 12:00:00 45 mL
  Filled 2020-04-21: qty 45

## 2020-04-21 MED ORDER — FENTANYL CITRATE (PF) 100 MCG/2ML IJ SOLN
INTRAMUSCULAR | Status: AC
  Administered 2020-04-21: 11:00:00 100 ug via INTRAVENOUS
  Filled 2020-04-21: qty 2

## 2020-04-21 MED ORDER — PROPOFOL 1000 MG/100ML IV EMUL
0.0000 ug/kg/min | INTRAVENOUS | Status: DC
  Administered 2020-04-21 – 2020-04-22 (×5): 35 ug/kg/min via INTRAVENOUS
  Administered 2020-04-22: 14:00:00 30 ug/kg/min via INTRAVENOUS
  Filled 2020-04-21: qty 100
  Filled 2020-04-21: qty 200
  Filled 2020-04-21 (×3): qty 100

## 2020-04-21 MED ORDER — BARICITINIB 2 MG PO TABS: 4.0000 mg | ORAL_TABLET | Freq: Every day | ORAL | Status: DC

## 2020-04-21 MED ORDER — POTASSIUM CHLORIDE 10 MEQ/100ML IV SOLN
10.0000 meq | INTRAVENOUS | Status: AC
  Administered 2020-04-21 (×2): 10 meq via INTRAVENOUS
  Filled 2020-04-21 (×2): qty 100

## 2020-04-21 MED ORDER — STERILE WATER FOR INJECTION IJ SOLN
INTRAMUSCULAR | Status: AC
  Administered 2020-04-21: 14:00:00 10 mL
  Filled 2020-04-21: qty 10

## 2020-04-21 NOTE — Procedures (Signed)
Central Venous Catheter Insertion Procedure Note  Cody Morgan  [id number]  1940/08/02  Date:04/21/20  Time:11:13 AM   Provider Performing:Pete Johnette Abraham Kary Kos   Procedure: Insertion of Non-tunneled Central Venous [phone]) with US guidance (31517)   Indication(s) Medication administration and Difficult access  Consent Risks of the procedure as well as the alternatives and risks of each were explained to the patient and/or caregiver.  Consent for the procedure was obtained and is signed in the bedside chart  Anesthesia Topical only with 1% lidocaine   Timeout Verified patient identification, verified procedure, site/side was marked, verified correct patient position, special equipment/implants available, medications/allergies/relevant history reviewed, required imaging and test results available.  Sterile Technique Maximal sterile technique including full sterile barrier drape, hand hygiene, sterile gown, sterile gloves, mask, hair covering, sterile ultrasound probe cover (if used).  Procedure Description Area of catheter insertion was cleaned with chlorhexidine and draped in sterile fashion.  With real-time ultrasound guidance a central venous catheter was placed into the right subclavian vein. Nonpulsatile blood flow and easy flushing noted in all ports.  The catheter was sutured in place and sterile dressing applied.  Complications/Tolerance None; patient tolerated the procedure well. Chest X-ray is ordered to verify placement for internal jugular or subclavian cannulation.   Chest x-ray is not ordered for femoral cannulation.  EBL Minimal  Specimen(s) None  Erick Colace ACNP-BC [place] Pager # [phone] OR # [phone] if no answer

## 2020-04-21 NOTE — Progress Notes (Signed)
NAME:  Cody Morgan, MRN:  [id number], DOB:  Nov 02, 1940, LOS: 2 ADMISSION DATE:  04/29/2020, CONSULTATION DATE:  12/18 REFERRING MD:  Vianne Bulls, CHIEF COMPLAINT:  Pneumothorax, respiratory failure  Brief History:  79 yo male never smoker non vaccinated onset sympotms around 10 d p  Thanksgiving but rapidly declining 48 h PTA  dx covid pneumonia, acute R pneumothorax  PPCM asked to consult 12/18   Past Medical History:  DM2 HLD  Significant Hospital Events:  12/18 admitted.  Started on Decadron and remdesivir, right chest tube placed, started on IV heparin for pulmonary emboli 12/19 changed to high-dose steroids, baricitinib initiated 12/20 worsening respiratory failure placed on BiPAP decision made to intubate Consults:  PCCM  Procedures:  Pigtail chest tube 12/18   Significant Diagnostic Tests:  CTa 12/18  Segmental PE on R s strain Venous dopplers 12/19 >>> negative  Micro Data:  Resp viral PCR >  Neg flu/  POS COVID 19 Blood cx 12/18>  Antimicrobials/covid rx :  remdesivir  12/18 Dexamethasone  12/18  Baricitinib  12/19   Interim History / Subjective:  Very short of breath  Objective   Blood pressure 137/77, pulse 84, temperature 97.7 F (36.5 C), temperature source Axillary, resp. rate (Abnormal) 37, height 5\' 10"  (1.778 m), weight 82.9 kg, SpO2 (Abnormal) 82 %.    Vent Mode: BIPAP;PCV FiO2 (%):  [100 %] 100 % Set Rate:  [14 bmp] 14 bmp PEEP:  [12 cmH20] 12 cmH20   Intake/Output Summary (Last 24 hours) at 04/21/2020 0833 Last data filed at 04/21/2020 7322 Gross per 24 hour  Intake 984.65 ml  Output 1130 ml  Net -145.35 ml   Filed Weights   04/20/20 0030 04/20/20 0110 04/21/20 0500  Weight: 82.9 kg 82.9 kg 82.9 kg    Examination: General this is an 79 year old male patient currently on 100% FiO2 on BiPAP.  Exhibiting marked accessory use, only able to speak in 1-2 word phrases.  Respiratory rate in the mid 30s HEENT no JVD mucous membranes moist  BiPAP mask in place Pulmonary diminished bilaterally little more on the right than left.  Exhibiting right 1 out of 7 airleak from right chest tube dressing is intact Cardiac regular rate and rhythm Abdomen soft Extremities warm dry Neuro awake, verbal, follows commands.  Does seem to have limited memory GU clear yellow Resolved Hospital Problem list    Assessment & Plan:     Acute hypoxic respiratory failure due to COVID-19 viral pneumonia w/ evolving ARDS -respiratory failure continues to decline overnight 12/19 to 12/20 -remains hypoxic on BIPAP pcxr w/ diffuse airspace disease; chest tube in place. Very small apical PTX. On left. Diffuse bilateral airspace disease cont; w/out sig change  Plan Day 3 steroids (changed to higher dose 12/19); will hold here X 5d total  then slowly taper Day 3/5 Remdesivir  Baricitinib day 2/14 Intubate/ventilate goal 6cc/kg/pbw pplat goal <30/driving pressure goal <15 Ck ABG s/p intubation. If P/F ratio <150 VAP bundle  PAD protocol RASS goal -4 Am cxr   Right spontaneous pneumothorax secondary to Covid pneumonia Plan Cont ct to sxn Am cxr  Acute Pulmonary  embolism / R segmental s RV strain -LE [place] negative 12/19 Plan Cont systemic AC  Acute metabolic encephalopathy.  Suspect exacerbated by hypoxia Plan Supportive care  AKI  Scr improved/normalized. Peaked at 1.31 Plan Avoid hypotension Adjust meds renally Am chem   Hyperglycemia Plan ssi goal glucose 140-180 lantus currently at 10 units daily  Best practice (evaluated  daily)  Glycemic control: Sliding scale insulin and Lantus last adjusted 12/20 Sedation protocol: Initiated 12/20, RASS goal -4 GI prophylaxis: PPI 12/20 Anticoagulation: On systemic heparin for pulmonary emboli Diet: Initiate tube feeds 12/20 Activity: Bedrest  CODE STATUS: Full code Disposition: Transfer to critical care  Goals of care discussion: I spoke with the patient at bedside, I am not  completely convinced he understands the severity of his illness.  He did endorse he would want ventilatory support if this was his only chance to survive.  He did indicate he would not want life lung support.  I had further discussion with his daughter [name].  Made her aware of severely poor prognosis.  At this point we will proceed with intubation/ventilation/central access and pressors as indicated I have advised against CPR should the patient arrest, she is taking this in advisement with plan to discuss this further with her brother  Critical care time 36 minutes  Erick Colace ACNP-BC [place] Pager # [phone] OR # [phone] if no answer

## 2020-04-21 NOTE — Progress Notes (Signed)
RT attempted ABG and was unsuccessful. RN aware. RT nightshift to attempt.

## 2020-04-21 NOTE — Procedures (Signed)
Intubation Procedure Note  Cody Morgan  [id number]  [birth year]  Date:04/21/20  Time:11:13 AM   Provider Performing:Pete E Kary Kos    Procedure: Intubation (82800)  Indication(s) Respiratory Failure  Consent Risks of the procedure as well as the alternatives and risks of each were explained to the patient and/or caregiver.  Consent for the procedure was obtained and is signed in the bedside chart   Anesthesia Etomidate, Versed, Fentanyl and Rocuronium   Time Out Verified patient identification, verified procedure, site/side was marked, verified correct patient position, special equipment/implants available, medications/allergies/relevant history reviewed, required imaging and test results available.   Sterile Technique Usual hand hygeine, masks, and gloves were used   Procedure Description Patient positioned in bed supine.  Sedation given as noted above.  Patient was intubated with endotracheal tube using Glidescope.  View was Grade 1 full glottis .  Number of attempts was 1.  Colorimetric CO2 detector was consistent with tracheal placement.   Complications/Tolerance None; patient tolerated the procedure well. Chest X-ray is ordered to verify placement.   EBL Minimal   Specimen(s) None  Erick Colace ACNP-BC [place] Pager # [phone] OR # [phone] if no answer

## 2020-04-21 NOTE — Progress Notes (Signed)
eLink Physician-Brief Progress Note Patient Name: Cody Morgan DOB: 05/19/1940 MRN: [id number]   Date of Service  04/21/2020  HPI/Events of Note  Hypoxia - Increased O2 requirement from HFNC to HFNC + NRB. Sats still only 81% and 2. NSVT - K+ = 3.8 and Mg++ = 2.5.   eICU Interventions  Plan: 1. BiPAP  2. Replace K+.     Intervention Category Major Interventions: Hypoxemia - evaluation and management  Robin Pafford Eugene 04/21/2020, 6:16 AM

## 2020-04-21 NOTE — Procedures (Signed)
Central Venous Catheter Insertion Procedure Note  Cody Morgan  [id number]  1941-02-06  Date:04/21/20  Time:1:50 PM   Provider Performing:Pete Johnette Abraham Kary Kos   Procedure: Insertion of Non-tunneled Central Venous [id number]) with US guidance (46190)   Indication(s) Difficult access  Consent Risks of the procedure as well as the alternatives and risks of each were explained to the patient and/or caregiver.  Consent for the procedure was obtained and is signed in the bedside chart  Anesthesia Topical only with 1% lidocaine   Timeout Verified patient identification, verified procedure, site/side was marked, verified correct patient position, special equipment/implants available, medications/allergies/relevant history reviewed, required imaging and test results available.  Sterile Technique Maximal sterile technique including full sterile barrier drape, hand hygiene, sterile gown, sterile gloves, mask, hair covering, sterile ultrasound probe cover (if used).  Procedure Description Area of catheter insertion was cleaned with chlorhexidine and draped in sterile fashion.  With real-time ultrasound guidance a central venous catheter was placed into the left femoral vein. Nonpulsatile blood flow and easy flushing noted in all ports.  The catheter was sutured in place and sterile dressing applied.  Complications/Tolerance None; patient tolerated the procedure well. Chest X-ray is ordered to verify placement for internal jugular or subclavian cannulation.   Chest x-ray is not ordered for femoral cannulation.  EBL Minimal  Specimen(s) None  Erick Colace ACNP-BC Cody Morgan Pager # [phone] OR # [phone] if no answer

## 2020-04-21 NOTE — Progress Notes (Signed)
Patient failed heated HFNC. Patient trialed on BIPAP at this time. Patient's oxygen saturations are still not improving. Patient stating he is more tired and it is harder to breath. RN and CCM MD notified.

## 2020-04-21 NOTE — Progress Notes (Signed)
[facility] Progress Note Patient Name: Cody Morgan DOB: Jan 09, 1941 MRN: [id number]   Date of Service  04/21/2020  HPI/Events of Note  Oliguria - Femoral CVL not able to get CVP. Albumin = 2.1.   eICU Interventions  Plan: 1. 25% Albumin 12.5 gm IV X 1 now.      Intervention Category Major Interventions: Other:  Lysle Dingwall 04/21/2020, 9:38 PM

## 2020-04-21 NOTE — Progress Notes (Signed)
Assisted tele visit to patient with daughter. ° °Cody Morgan P, RN  °

## 2020-04-21 NOTE — Progress Notes (Signed)
Initial Nutrition Assessment  DOCUMENTATION CODES:   Not applicable  INTERVENTION:  - will adjust TF regimen: Vital 1.5 @ 20 ml/hr to advance by 10 ml every 12 hours to reach goal rate of 45 ml/hr with 44ml Prosource TF x5/day.  - at goal rate, this regimen + kcal from current propofol rate will provide 2148 kcal, 128 grams protein, and 823 ml free water.  - free water flush to continue to be per CCM.   Monitor magnesium, potassium, and phosphorus daily for at least 3 days, MD to replete as needed, as pt is at risk for refeeding syndrome given chart notes stating daughter reported patient with very poor oral intake PTA, percent wt loss.   NUTRITION DIAGNOSIS:   Increased nutrient needs related to acute illness,catabolic illness (IHWTU-88 infection) as evidenced by estimated needs  GOAL:   Patient will meet greater than or equal to 90% of their needs  MONITOR:   Vent status,TF tolerance,Labs,Weight trends  REASON FOR ASSESSMENT:   Ventilator,Consult Enteral/tube feeding initiation and management  ASSESSMENT:   79 year old male who identifies as male and male. He has medical history of type 2 DM, HLD, HTN, remote hx of prostate cancer, obesity, and remote hx of gender reaffirming surgery of the genitalia ~20 years ago. Patient presented to the ED via EMS d/t severe shortness of breath. Patient's daughter reported that shortness of breath and cough began the weekend after Thanksgiving. Patient is not vaccinated against COVID-19. Patient's wife presented [facility] on 12/8 d/t COVID-related complications and died on 05/01/23. Patient has been experiencing weakness and extremely poor oral intake for the several days PTA. In the ED, found to have R-sided pneumothorax and chest tube was placed.  Patient is intubated with R nare NGT placed earlier today (gastric). He was intubated today at ~1115.   Weight today is 183 lb and is consistent with weight yesterday. PTA, the only other weight  documented is from [facility] on 02/01/20 when patient weighed 207 lb. This indicates 24 lb weight loss (11.6% body weight) in the past 2.5 months; significant for time frame.   RD unable to see patient in person today and NFPE will need to be completed at follow-up. No information documented in the edema section of flow sheet.  Orders currently in place for TF per protocol: Vital High Protein @ 40 ml/hr with 45 ml Prosource TF BID and order for 200 ml free water QID.   Per notes: - COVID PNA with developing ARDS - R-sided spontaneous pneumothorax s/p chest tube placement - pulmonary embolism - acute metabolic encephalopathy  - AKI - hyperglycemia with CBG goal of 140-180 mg/dl   Patient is currently intubated on ventilator support MV: 11.5 L/min Temp (24hrs), Avg:97.7 F (36.5 C), Min:97.1 F (36.2 C), Max:98 F (36.7 C) Propofol: 12.44 ml/hr (328 kcal).   Labs reviewed; CBGs: 143, 153, 149 mg/dl, BUN: 35 mg/dl, Ca: 8.1 mg/dl, Mg: 2.5 mg/dl. Medications reviewed; 500 mg ascorbic acid/day, 100 mg colace BID, sliding scale novolog, 10 units lantus/day, 40 mg IV protonix/day, 17 g miralax/day, 10 mEq IV KCl x2 runs 12/20, 100 mg IV remdesivir x1 run/day x4 days (12/19-12/22), 220 mg zinc sulfate/day. Drips; fentanyl @ 50 mcg/hr, heparin @ 1300 units/hr, propofol @ 25 mcg/kg/min, vec @ 1 mcg/kg/min    NUTRITION - FOCUSED PHYSICAL EXAM:  unable to complete at this time.   Diet Order:   Diet Order            Diet NPO time  specified Except for: [employer], Other (See Comments), Sips with Meds  Diet effective now                 EDUCATION NEEDS:   Not appropriate for education at this time  Skin:  Skin Assessment: Reviewed RN Assessment  Last BM:  PTA/unknown  Height:   Ht Readings from Last 1 Encounters:  04/20/20 5\' 10"  (1.778 m)    Weight:   Wt Readings from Last 1 Encounters:  04/21/20 82.9 kg    Estimated Nutritional Needs:  Kcal:  2200 kcal Protein:   120-130 grams Fluid:  >/= 3 L/day     Jarome Matin, MS, RD, LDN, CNSC Inpatient Clinical Dietitian RD pager # available in [facility]  After hours/weekend pager # available in [facility]

## 2020-04-21 NOTE — TOC Progression Note (Signed)
Transition of Care [facility]) - Progression Note    Patient Details  Name: Cody Morgan MRN: [id number] Date of Birth: 01-29-1941  Transition of [facility] CM/SW Contact  Leeroy Cha, RN Phone Number: 04/21/2020, 2:47 PM  Clinical Narrative:    Significant Hospital Events:  12/18 admitted.  Started on Decadron and remdesivir, right chest tube placed, started on IV heparin for pulmonary emboli 12/19 changed to high-dose steroids, baricitinib initiated 12/20 worsening respiratory failure placed on BiPAP decision made to intubate Plan follow for toc needs         Expected Discharge Plan and Services                                                 Social Determinants of Health (SDOH) Interventions    Readmission Risk Interventions No flowsheet data found.

## 2020-04-21 NOTE — Progress Notes (Signed)
Palliative Care Brief Note  Discussed this AM with hospitalist as well as with PCCM. Patient was just intubated today.  Will hold on palliative consult at this point to see how he does clinically over the next couple of days.  Will sign off for now, but our team is happy to engage if we can be of assistance.  Please call or reconsult if we can be of further assistance moving forward.  Micheline Rough, MD [place] Palliative Medicine Team [phone]  NO CHARGE NOTE

## 2020-04-21 NOTE — Progress Notes (Signed)
ANTICOAGULATION CONSULT NOTE  Pharmacy Consult for heparin Indication: pulmonary embolus  No Known Allergies  Patient Measurements: Height: 5\' 10"  (177.8 cm) Weight: 82.9 kg (182 lb 12.2 oz) IBW/kg (Calculated) : 73 Heparin Dosing Weight: 82.9kg  Vital Signs: Temp: 97.1 F (36.2 C) (12/20 0000) Temp Source: Axillary (12/20 0000) BP: 142/83 (12/19 2000) Pulse Rate: 74 (12/19 2000)  Labs: Recent Labs    04/28/2020 1856 04/15/2020 2115 04/20/20 0109 04/20/20 0949 04/20/20 1833 04/21/20 0235  HGB 17.0  --  16.4  --   --  14.9  HCT 51.2  --  49.5  --   --  44.5  PLT 329  --  273  --   --  251  APTT  --   --  28  --   --   --   LABPROT  --   --  16.5*  --   --   --   INR  --   --  1.4*  --   --   --   HEPARINUNFRC  --   --   --  0.46 0.56 0.54  CREATININE 1.29*  --  1.31*  --   --  0.94  TROPONINIHS  --  8  --   --   --   --     Estimated Creatinine Clearance (by C-G formula based on SCr of 0.94 mg/dL) Male: 56 mL/min Male: 64.7 mL/min   Medical History: Past Medical History:  Diagnosis Date  . Diabetes mellitus type 2 in nonobese (HCC)   . HLD (hyperlipidemia)   . Mixed hyperlipidemia due to type 2 diabetes mellitus ([place]) 04/06/2020     Assessment: 79 year old patient that was assigned male sex at birth and now identifies as male or male gender with past medical history of diabetes mellitus type 2, hyperlipidemia, hypertension, remote history of prostate cancer, obesity and remote history of gender firming surgery of the genitalia approximately 20 years ago who presents to [facility] long hospital emergency department with severe shortness of breath via EMS.  Pharmacy consulted to dose heparin for PE.  No prior AC noted  04/21/2020   HL 0.54 at goal  CBC WNL  No bleeding or infusion issues noted  Goal of Therapy:  Heparin level 0.3-0.7 units/ml Monitor platelets by anticoagulation protocol:   Plan:  Continue heparin infusion at 1300 units/hr Check a  confirmatory heparin level in 8 hours Daily CBC  Dolly Rias RPh 04/21/2020, 5:03 AM

## 2020-04-22 ENCOUNTER — Inpatient Hospital Stay (HOSPITAL_COMMUNITY): Payer: PPO

## 2020-04-22 DIAGNOSIS — U071 COVID-19: Secondary | ICD-10-CM | POA: Diagnosis not present

## 2020-04-22 LAB — COMPREHENSIVE METABOLIC PANEL
ALT: 21 U/L (ref 0–44)
AST: 20 U/L (ref 15–41)
Albumin: 2.5 g/dL — ABNORMAL LOW (ref 3.5–5.0)
Alkaline Phosphatase: 78 U/L (ref 38–126)
Anion gap: 14 (ref 5–15)
BUN: 52 mg/dL — ABNORMAL HIGH (ref 8–23)
CO2: 23 mmol/L (ref 22–32)
Calcium: 8.5 mg/dL — ABNORMAL LOW (ref 8.9–10.3)
Chloride: 104 mmol/L (ref 98–111)
Creatinine, Ser: 1.17 mg/dL (ref 0.61–1.24)
GFR, Estimated: >60 mL/min (ref >60)
Glucose, Bld: 209 mg/dL — ABNORMAL HIGH (ref 70–99)
Potassium: 4.7 mmol/L (ref 3.5–5.1)
Sodium: 141 mmol/L (ref 135–145)
Total Bilirubin: 0.9 mg/dL (ref 0.3–1.2)
Total Protein: 6.7 g/dL (ref 6.5–8.1)

## 2020-04-22 LAB — MAGNESIUM: Magnesium: 2.9 mg/dL — ABNORMAL HIGH (ref 1.7–2.4)

## 2020-04-22 LAB — GLUCOSE, CAPILLARY
Glucose-Capillary: 196 mg/dL — ABNORMAL HIGH (ref 70–99)
Glucose-Capillary: 150 mg/dL — ABNORMAL HIGH (ref 70–99)
Glucose-Capillary: 192 mg/dL — ABNORMAL HIGH (ref 70–99)

## 2020-04-22 LAB — CBC WITH DIFFERENTIAL/PLATELET
Abs Immature Granulocytes: 0.42 10*3/uL — ABNORMAL HIGH (ref 0.00–0.07)
Basophils Absolute: 0.1 10*3/uL (ref 0.0–0.1)
Basophils Relative: 0 %
Eosinophils Absolute: 0 10*3/uL (ref 0.0–0.5)
Eosinophils Relative: 0 %
HCT: 47.2 % (ref 39.0–52.0)
Hemoglobin: 15.1 g/dL (ref 13.0–17.0)
Immature Granulocytes: 2 %
Lymphocytes Relative: 6 %
Lymphs Abs: 1.5 10*3/uL (ref 0.7–4.0)
MCH: 30.6 pg (ref 26.0–34.0)
MCHC: 32 g/dL (ref 30.0–36.0)
MCV: 95.7 fL (ref 80.0–100.0)
Monocytes Absolute: 1.2 10*3/uL — ABNORMAL HIGH (ref 0.1–1.0)
Monocytes Relative: 5 %
Neutro Abs: 20.5 10*3/uL — ABNORMAL HIGH (ref 1.7–7.7)
Neutrophils Relative %: 87 %
Platelets: 352 10*3/uL (ref 150–400)
RBC: 4.93 MIL/uL (ref 4.22–5.81)
RDW: 13.1 % (ref 11.5–15.5)
WBC: 23.8 10*3/uL — ABNORMAL HIGH (ref 4.0–10.5)
nRBC: 0 % (ref 0.0–0.2)

## 2020-04-22 LAB — HEPARIN LEVEL (UNFRACTIONATED)
Heparin Unfractionated: 1.06 IU/mL — ABNORMAL HIGH (ref 0.30–0.70)
Heparin Unfractionated: 1.03 IU/mL — ABNORMAL HIGH (ref 0.30–0.70)

## 2020-04-22 LAB — TRIGLYCERIDES: Triglycerides: 191 mg/dL — ABNORMAL HIGH (ref <150)

## 2020-04-22 LAB — PHOSPHORUS: Phosphorus: 6.3 mg/dL — ABNORMAL HIGH (ref 2.5–4.6)

## 2020-04-22 LAB — C-REACTIVE PROTEIN: CRP: 18.3 mg/dL — ABNORMAL HIGH (ref <1.0)

## 2020-04-22 LAB — D-DIMER, QUANTITATIVE: D-Dimer, Quant: 2.91 ug/mL-FEU — ABNORMAL HIGH (ref 0.00–0.50)

## 2020-04-22 MED ORDER — LACTATED RINGERS IV BOLUS
1000.0000 mL | Freq: Once | INTRAVENOUS | Status: AC
  Administered 2020-04-22: 06:00:00 1000 mL via INTRAVENOUS

## 2020-04-22 MED ORDER — HEPARIN (PORCINE) 25000 UT/250ML-% IV SOLN: 1100.0000 [IU]/h | INTRAVENOUS | Status: DC

## 2020-04-24 LAB — GLUCOSE, CAPILLARY: Glucose-Capillary: 151 mg/dL — ABNORMAL HIGH (ref 70–99)

## 2020-04-25 LAB — CULTURE, BLOOD (ROUTINE X 2): Culture: NO GROWTH

## 2020-05-03 NOTE — Progress Notes (Signed)
ANTICOAGULATION CONSULT NOTE  Pharmacy Consult for heparin Indication: pulmonary embolus  No Known Allergies  Patient Measurements: Height: 5\' 10"  (177.8 cm) Weight: 82.9 kg (182 lb 12.2 oz) IBW/kg (Calculated) : 73 Heparin Dosing Weight: 82.9kg  Vital Signs: Temp: 96.26 F (35.7 C) (12/21 1100) Temp Source: Bladder (12/21 1100) BP: 96/59 (12/21 0900) Pulse Rate: 95 (12/21 1100)  Labs: Recent Labs    05-10-2020 2115 04/20/20 0109 04/20/20 0949 04/21/20 0235 04/24/2020 0540 04/07/2020 1031  HGB  --  16.4  --  14.9 15.1  --   HCT  --  49.5  --  44.5 47.2  --   PLT  --  273  --  251 352  --   APTT  --  28  --   --   --   --   LABPROT  --  16.5*  --   --   --   --   INR  --  1.4*  --   --   --   --   HEPARINUNFRC  --   --    < > 0.54 1.06* 1.03*  CREATININE  --  1.31*  --  0.94 1.17  --   TROPONINIHS 8  --   --   --   --   --    < > = values in this interval not displayed.    Estimated Creatinine Clearance (by C-G formula based on SCr of 1.17 mg/dL) Male: 45 mL/min Male: 52 mL/min   Medical History: Past Medical History:  Diagnosis Date  . Diabetes mellitus type 2 in nonobese (HCC)   . HLD (hyperlipidemia)   . Mixed hyperlipidemia due to type 2 diabetes mellitus ([place]) 05/10/2020     Assessment: 80 y/o M with a h/o DM. Pharmacy consulted to dose heparin for PE.  No prior AC noted  04/16/2020   HL 1.06, had lab repeat with peripheral stick, resulting HL 1.03  CBC WNL  No bleeding or infusion issues per RN  Goal of Therapy:  Heparin level 0.3-0.7 units/ml Monitor platelets by anticoagulation protocol:   Plan:  Hold heparin infusion x 1 hour  After 1 hour, resume heparin infusion at 1100 units/hr Check a heparin level in 8 hours Daily CBC Monitor for bleeding  Napoleon Form  04/08/2020, 11:40 AM

## 2020-05-03 NOTE — Progress Notes (Signed)
Pt passed at 13, daughter [name] at bedside. Dr Melvyn Novas notified.

## 2020-05-03 NOTE — Progress Notes (Signed)
I spoke to the patient's daughter.  He is declining in spite of all aggressive therapies I do not think he will survive this illness, think we are doing more to him then for him at this point.  Family is in agreement with transition to comfort Plan Discontinue neuromuscular blockade Discontinue all labs Family discussing who wants to come to say goodbye with plan for compassionate extubation and transition to complete comfort

## 2020-05-03 NOTE — Discharge Summary (Addendum)
Physician Discharge Summary         Patient ID: TAFARI HUMISTON MRN: [id number] DOB/AGE: 06-28-1940  Admit date: 05-03-20 Discharge date: 04/11/2020  Discharge Diagnoses:    Patient Active Problem List   Diagnosis Date Noted  . ARDS (adult respiratory distress syndrome) ([place])   . Acute pulmonary embolism without acute cor pulmonale ([place]) 04/20/2020  . Acute respiratory failure with hypoxia ([place]) 2020-05-03  . Pneumothorax on right 2020-05-03  . COVID-19 virus infection 2020-05-03  . Elevated d-dimer 03-May-2020  . Type 2 diabetes mellitus with hyperglycemia, with long-term current use of insulin ([place]) 2020/05/03  . Mixed hyperlipidemia due to type 2 diabetes mellitus ([place]) 05-03-2020  . Essential hypertension 05/03/20  . Acute metabolic encephalopathy 95/01/3266  . Status post gender reassignment surgery 05-03-2020         Death summary   80 yo male never smoker non vaccinated onset sympotms around 10 d p  Thanksgiving but rapidly declining 48 h PTA  dx covid pneumonia, acute R pneumothorax  PPCM asked to consult 05-04-2023.    On 04/13/20: Day #4 systemic steroids, these were changed to higher dose on 12/19.    Day #4 of 5 remdesivir  Day #3 of 14 baricitinib   p continued to deteriorate with septic shock requiring levophed and worsening gas exchange requiring proning but despite these efforts he continued to decline.  Fm was fully informed both before and after interventions were carried out that prognosis was likely to worsen with each new modality added to his regimen and made the decision to limit his care to comfort measures on the afternoon of 12/21 and he expired with these in place after fm given a chance to visit him at bedside.     Christinia Gully, MD Pulmonary and [place] [phone]   After 7:00 pm call El[place]  [phone]

## 2020-05-03 NOTE — Progress Notes (Signed)
Verbal order received from Marni Griffon NP to extubate patient after family has left from saying goodbye to patient

## 2020-05-03 NOTE — Progress Notes (Signed)
[facility] Progress Note Patient Name: Cody Morgan DOB: 28-Mar-1941 MRN: [id number]   Date of Service  05/01/20  HPI/Events of Note  Oliguria - Remains oliguric after 25% albumin. Creatinine = 0.94.  eICU Interventions  Plan: 1. Bolus with LR 1 liter IV over 1 hour now.      Intervention Category Major Interventions: Other:  Lysle Dingwall May 01, 2020, 3:35 AM

## 2020-05-03 NOTE — Progress Notes (Addendum)
NAME:  NAMARI DOSWELL, MRN:  [id number], DOB:  30-Jul-1940, LOS: 3 ADMISSION DATE:  04/27/2020, CONSULTATION DATE:  12/18 REFERRING MD:  Vianne Bulls, CHIEF COMPLAINT:  Pneumothorax, respiratory failure  Brief History:  80 yo male never smoker non vaccinated onset sympotms around 10 d p  Thanksgiving but rapidly declining 48 h PTA  dx covid pneumonia, acute R pneumothorax  PPCM asked to consult 12/18   Past Medical History:  DM2 HLD  Significant Hospital Events:  12/18 admitted.  Started on Decadron and remdesivir, right chest tube placed, started on IV heparin for pulmonary emboli 12/19 changed to high-dose steroids, baricitinib initiated 12/20 worsening respiratory failure placed on BiPAP decision made to intubate, placed on low tidal volume ventilation, rapid titration PEEP/FiO2.  Poor P/F ratio required patient to be placed in prone position.  Required norepinephrine to maintain systolic blood pressure.  Spoke to daughter, made DNR if he arrests 12/21: Still pressor dependent.  Pulse oximetry in low 80s on 100% FiO2 and 16 of PEEP once placed in the supine position.  Overall looks worse Consults:  PCCM  Procedures:  Pigtail chest tube 12/18   Significant Diagnostic Tests:  CTa 12/18  Segmental PE on R s strain Venous dopplers 12/19 >>> negative  Micro Data:  Resp viral PCR >  Neg flu/  POS COVID 19 Blood cx 12/18>  Antimicrobials/covid rx :  remdesivir  12/18 Dexamethasone  12/18  Baricitinib  12/19   Interim History / Subjective:  Sedated and paralyzed  Objective   Blood pressure (Abnormal) 96/59, pulse 94, temperature (Abnormal) 95.18 F (35.1 C), temperature source Bladder, resp. rate (Abnormal) 28, height 5\' 10"  (1.778 m), weight 82.9 kg, SpO2 (Abnormal) 85 %.    Vent Mode: PRVC FiO2 (%):  [100 %] 100 % Set Rate:  [28 bmp] 28 bmp Vt Set:  [430 mL] 430 mL PEEP:  [16 cmH20] 16 cmH20 Plateau Pressure:  [35 cmH20-46 cmH20] 35 cmH20   Intake/Output Summary (Last 24  hours) at April 23, 2020 1126 Last data filed at 04-23-20 1000 Gross per 24 hour  Intake 43645.02 ml  Output 870 ml  Net 42775.02 ml   Filed Weights   04/20/20 0110 04/21/20 0500 04/23/20 0500  Weight: 82.9 kg 82.9 kg 82.9 kg    Examination: General 80 year old male patient remains on full ventilatory support high PEEP and FiO2 HEENT normocephalic atraumatic no jugular venous distention orally intubated Pulmonary:dec bilaterally.  Current plateau pressure is 42 PEEP 16, FiO2 100% saturation is 82 Cardiac regular rate and rhythm Abdomen soft nontender Extremities warm dry Neuro heavily sedated GU clear yellow Resolved Hospital Problem list    Assessment & Plan:     Acute hypoxic respiratory failure due to COVID-19 viral pneumonia w/ evolving ARDS -respiratory failure continues to decline overnight 12/19 to 12/20 -remains hypoxic on BIPAP Chest x-ray with ongoing diffuse airspace disease very small apical loculated pneumothorax on the right Plan Day #4 systemic steroids, these were changed to higher dose on 12/19.  We will initiate taper on day #5  Day #4 of 5 remdesivir  Day #3 of 14 baricitinib  Continue low tidal volume ventilation  Continue to titrate PEEP/FiO2 for goal PaO2 greater than 65  Plateau pressure goal less than 30 driving pressure goal less than 15  We will discuss case with the patient's daughter, given poor oxygenation and ventilator mechanics could consider another round of proning, however ultimately I do not think patient will survive  Repeat a.m. chest x-ray  VAP bundle  PAD protocol RASS goal -4  Continue neuromuscular blockade unless we transition to comfort at which time would discontinue  Keep chest tube to suction as mentioned below   Right spontaneous pneumothorax secondary to Covid pneumonia Plan Cont CT to sxn w/ small ptx still present Am cxr   Circulatory shock.  Suspect pressor dependent due to sedating meds Plan Continue  norepinephrine  Acute Pulmonary  embolism / R segmental s RV strain -LE [place] negative 12/19 Plan Cont systemic AC  Acute metabolic encephalopathy.  Suspect exacerbated by hypoxia Plan Supportive care  AKI  Scr improved/normalized. Peaked at 1.31 Plan Cont trend cmp crrt per nephro Avoid hypotension Renal adjust meds  Hyperglycemia Plan Glycemic ssi Cont lantus 10units Goal 140-180   Best practice (evaluated daily)  Glycemic control: Sliding scale insulin and Lantus last adjusted 12/20 Sedation protocol: Initiated 12/20, RASS goal -4 GI prophylaxis: PPI 12/20 Anticoagulation: On systemic heparin for pulmonary emboli Diet: Initiate tube feeds 12/20 Activity: Bedrest  CODE STATUS: DNR Disposition: Transfer to critical care  Goals of care discussion: Last discussed with daughter [name] on 12/20.  DNR should patient arrest.  I think we should consider transitioning to comfort I do not believe this patient will survive  My critical care time is 32 minutes  Erick Colace ACNP-BC [place] Pager # [phone] OR # [phone] if no answer

## 2020-05-03 DEATH — deceased

## 2022-01-04 IMAGING — CT CT ANGIO CHEST
2 of 7 series · 17 of 46 positions shown · IV contrast (APPLIED)
Comparison: None.
COMPARISON: None.

Addendum:
CLINICAL DATA: Shortness of breath.  4MIJR-Y7.

EXAM:
CT ANGIOGRAPHY CHEST WITH CONTRAST
TECHNIQUE: Multidetector CT imaging of the chest was performed using the
standard protocol during bolus administration of intravenous
contrast. Multiplanar CT image reconstructions and MIPs were
obtained to evaluate the vascular anatomy.
CONTRAST:  100mL OMNIPAQUE IOHEXOL 350 MG/ML SOLN

[Series 5: thins · axial · 0.79mm/px · z∈[-264,-48]mm · 15 of 248 slices shown]
[im 16/248  lung]
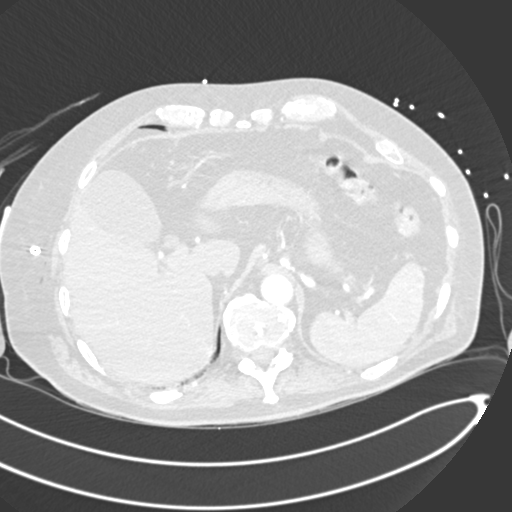
[im 31/248  soft-tissue]
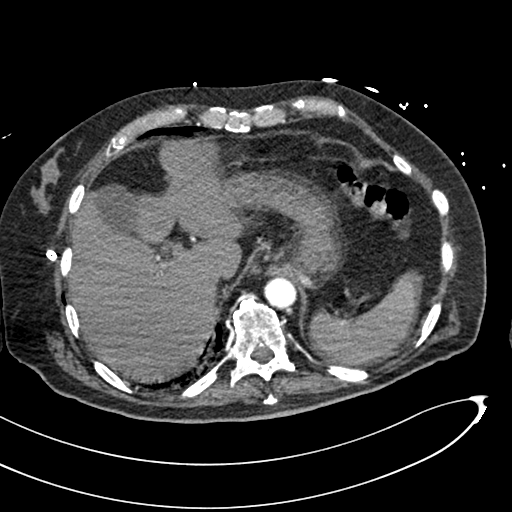
[im 47/248  lung]
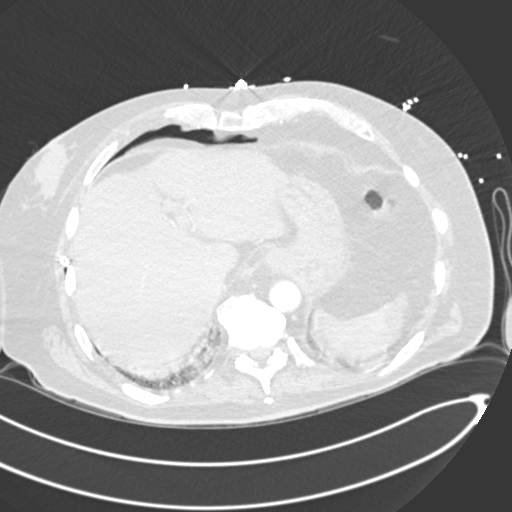
[im 62/248  soft-tissue]
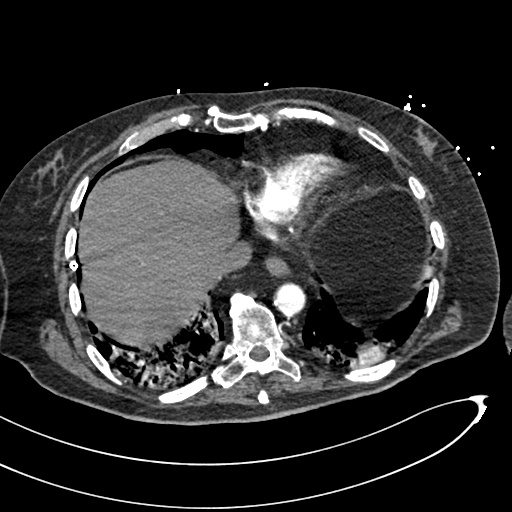
[im 78/248  lung]
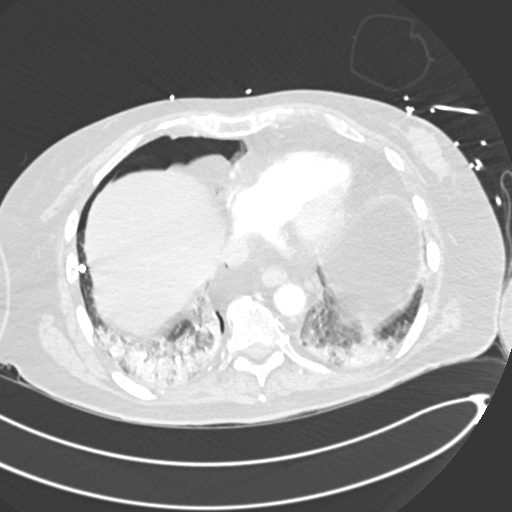
[im 93/248  soft-tissue]
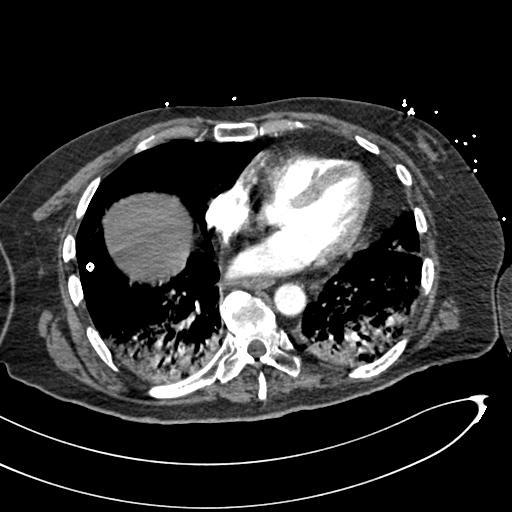
[im 109/248  lung]
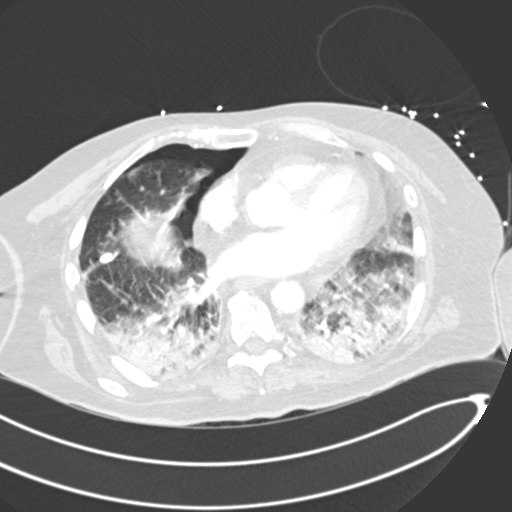
[im 124/248  soft-tissue]
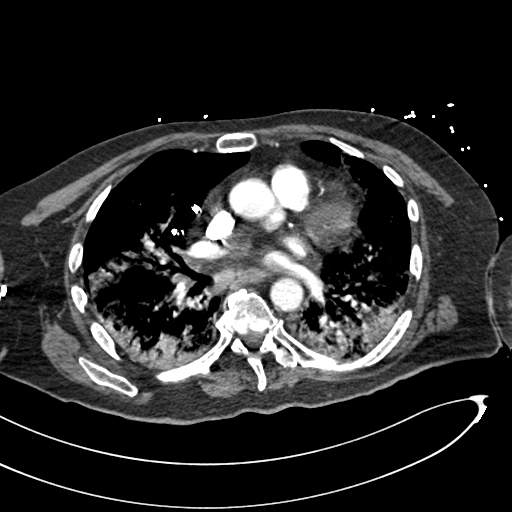
[im 139/248  lung]
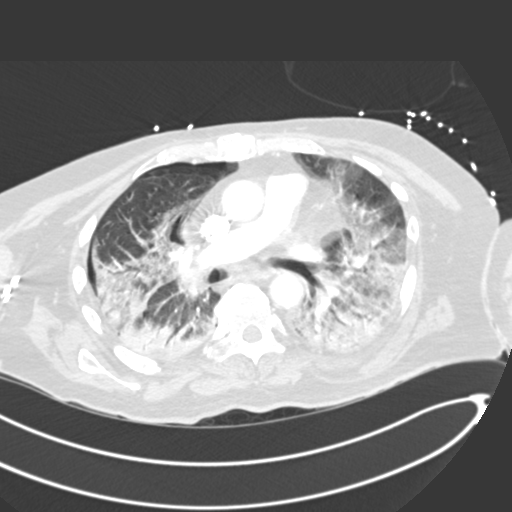
[im 155/248  soft-tissue]
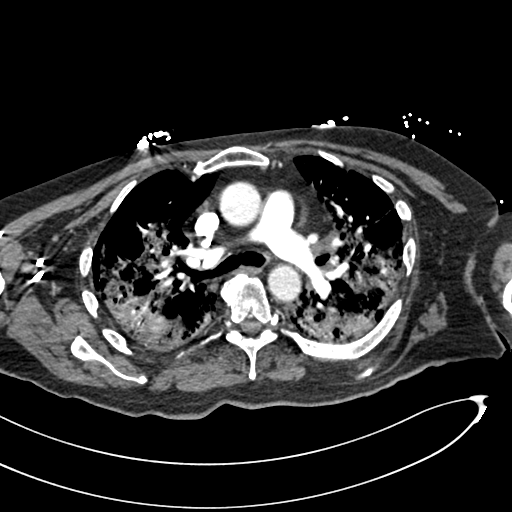
[im 170/248  lung]
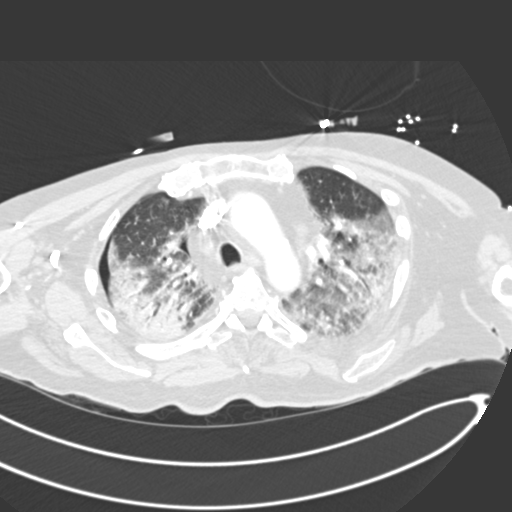
[im 186/248  soft-tissue]
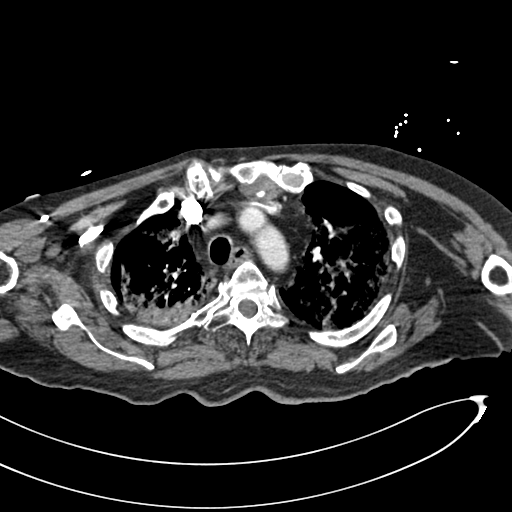
[im 201/248  lung]
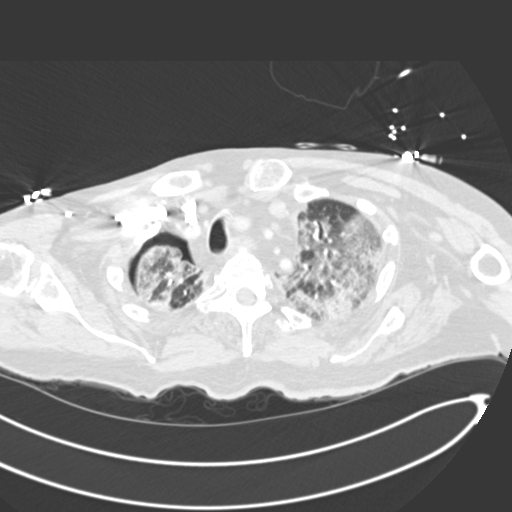
[im 217/248  soft-tissue]
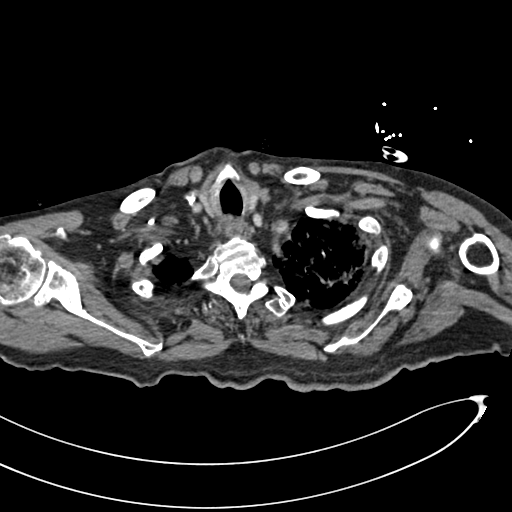
[im 232/248  lung]
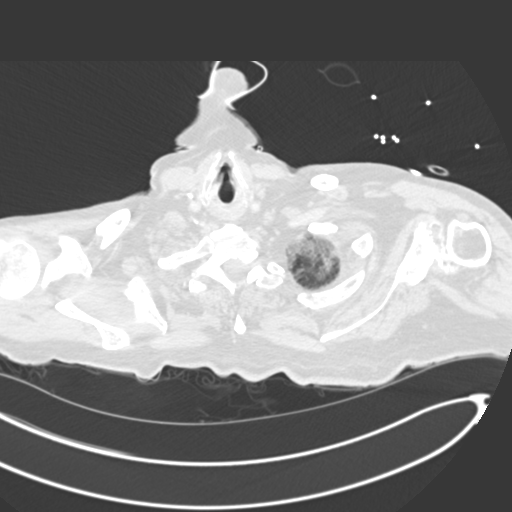

[Series 7: coronal mpr · coronal · 0.53mm/px · 2 of 92 slices shown]
[im 31/92  soft-tissue]
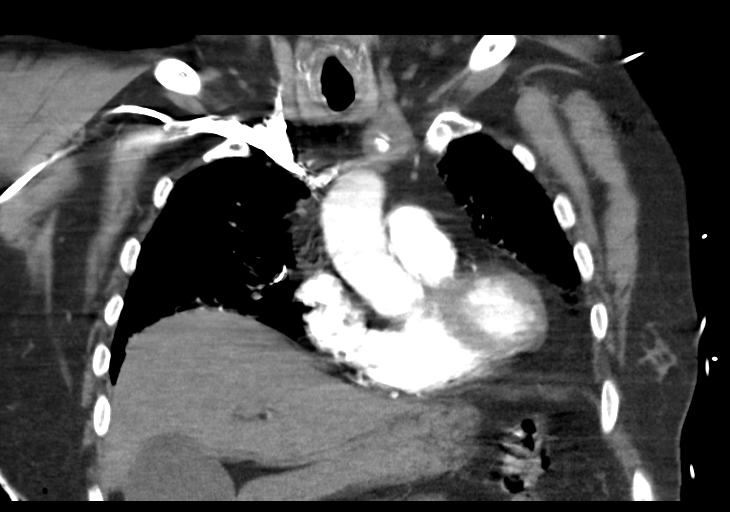
[im 61/92  soft-tissue]
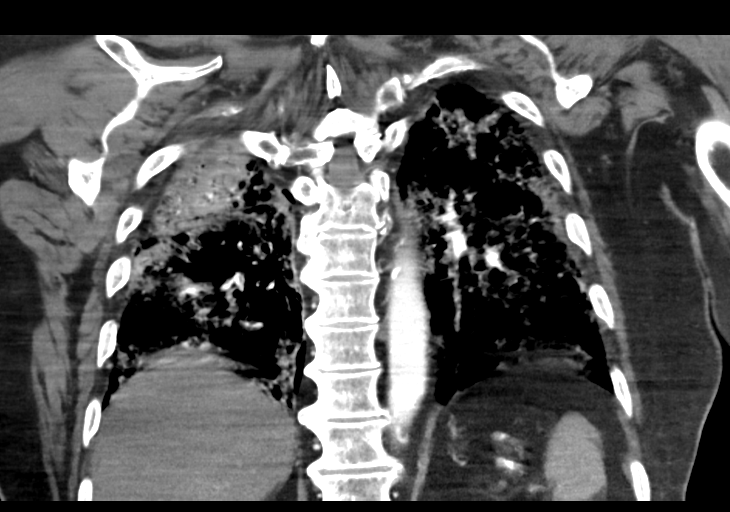

[17 of 46 positions shown; findings below may reference images not displayed]

FINDINGS: Cardiovascular: Satisfactory contrast bolus. Imaging degraded by
motion. Small filling defect in a right basilar segmental branch
(series 5, image 131). The size of the main pulmonary artery is
normal. Heart size is normal, with no pericardial effusion. The
course and caliber of the aorta are normal. There is atherosclerotic
calcification. No acute aortic syndrome.

Mediastinum/Nodes: No mediastinal, hilar or axillary
lymphadenopathy. Normal visualized thyroid. Thoracic esophageal
course is normal.

Lungs/Pleura: Multifocal, peripheral predominant ground glass
opacities. No pleural effusion. Small right pneumothorax. Chest tube
is near the right lung apex.

Upper Abdomen: Contrast bolus timing is not optimized for evaluation
of the abdominal organs. The visualized portions of the organs of
the upper abdomen are normal.

Musculoskeletal: No chest wall abnormality. No bony spinal canal
stenosis.

Review of the MIP images confirms the above findings.
IMPRESSION: 1. Motion degraded study.
2. Small right basilar segmental branch pulmonary embolus. No CT
evidence of right heart strain.
3. Small right pneumothorax. Chest tube is near the right lung apex.
4. 4MIJR-Y7 pattern pneumonia.

Aortic Atherosclerosis (YLZEK-RL9.9).

ADDENDUM:
Critical Value/emergent results were called by telephone at the time
of interpretation on 04/20/2020 at [DATE] to provider DIONELYS
AFQIR , who verbally acknowledged these results.

*** End of Addendum ***
FINDINGS: Cardiovascular: Satisfactory contrast bolus. Imaging degraded by
motion. Small filling defect in a right basilar segmental branch
(series 5, image 131). The size of the main pulmonary artery is
normal. Heart size is normal, with no pericardial effusion. The
course and caliber of the aorta are normal. There is atherosclerotic
calcification. No acute aortic syndrome.

Mediastinum/Nodes: No mediastinal, hilar or axillary
lymphadenopathy. Normal visualized thyroid. Thoracic esophageal
course is normal.

Lungs/Pleura: Multifocal, peripheral predominant ground glass
opacities. No pleural effusion. Small right pneumothorax. Chest tube
is near the right lung apex.

Upper Abdomen: Contrast bolus timing is not optimized for evaluation
of the abdominal organs. The visualized portions of the organs of
the upper abdomen are normal.

Musculoskeletal: No chest wall abnormality. No bony spinal canal
stenosis.

Review of the MIP images confirms the above findings.
IMPRESSION: 1. Motion degraded study.
2. Small right basilar segmental branch pulmonary embolus. No CT
evidence of right heart strain.
3. Small right pneumothorax. Chest tube is near the right lung apex.
4. 4MIJR-Y7 pattern pneumonia.

Aortic Atherosclerosis (YLZEK-RL9.9).

## 2022-01-05 IMAGING — DX DG CHEST 1V PORT
1 series · 1 of 1 positions shown · non-contrast
Comparison: April 19, 2020

CLINICAL DATA: Follow-up pneumothorax.  U1IXL-YE positive.

EXAM:
PORTABLE CHEST 1 VIEW

[chest ap]
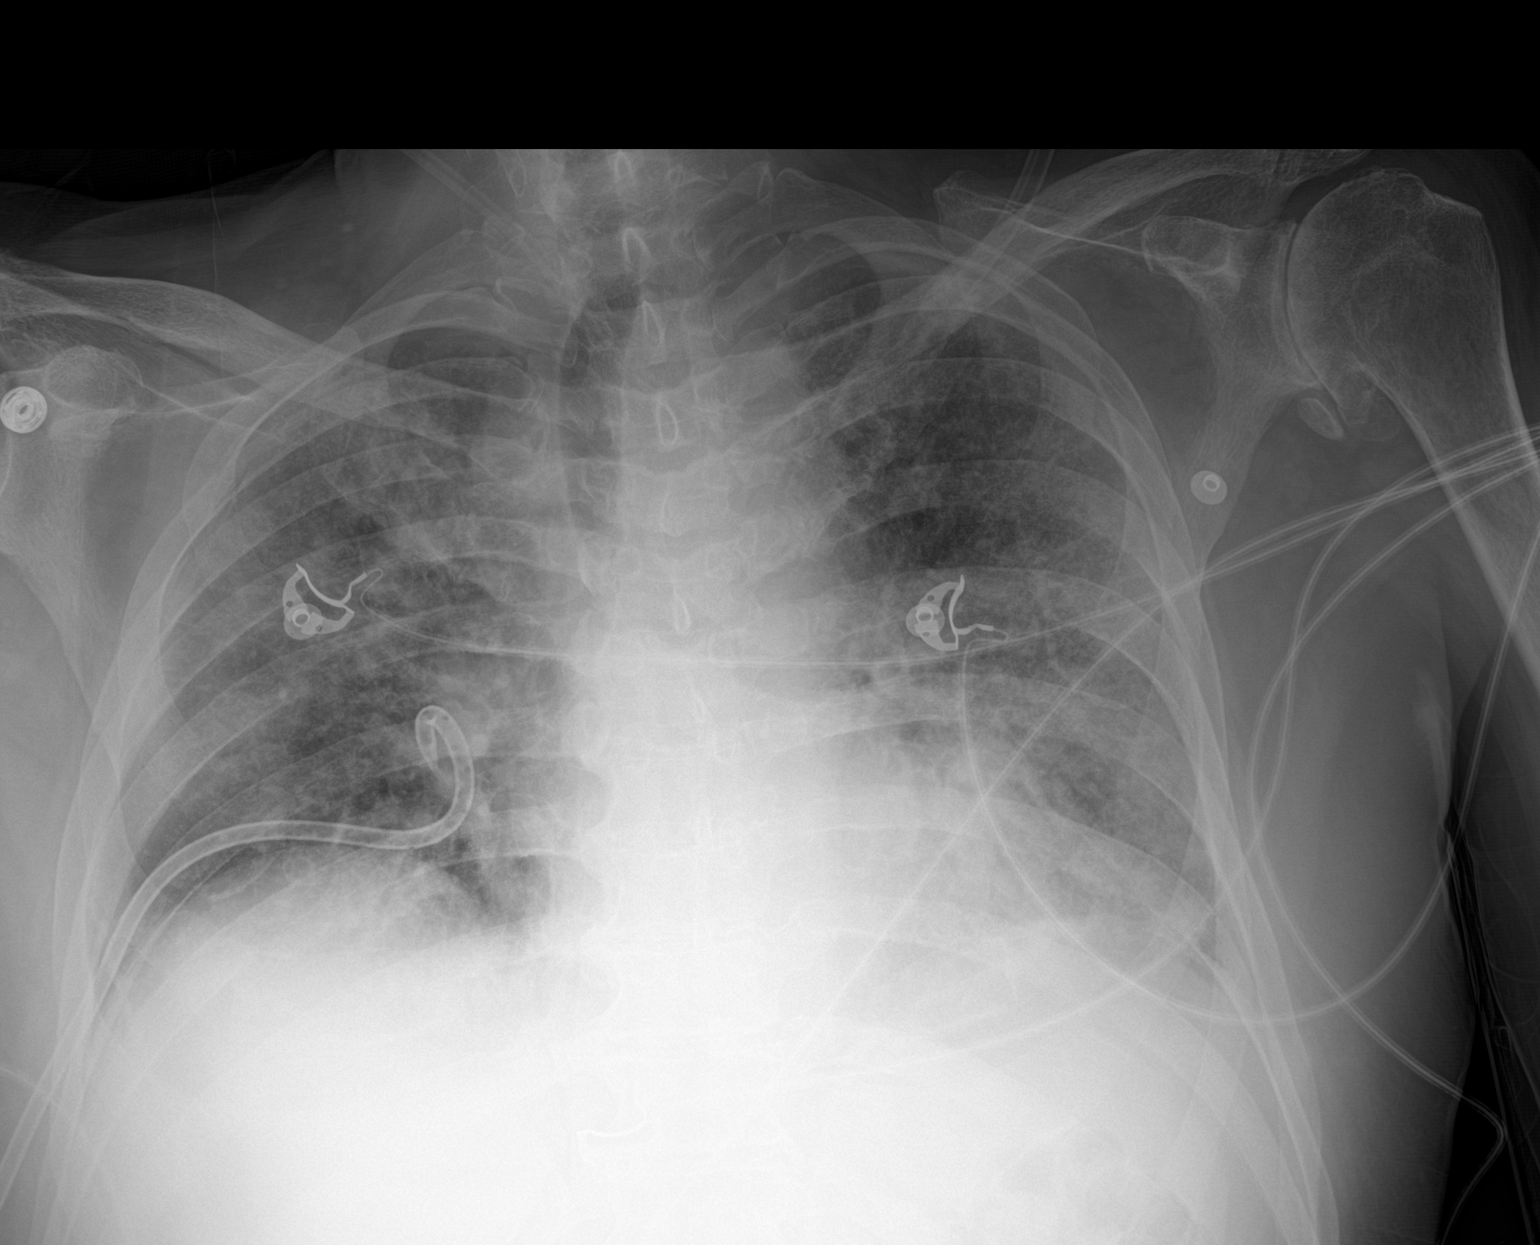

[1 of 1 positions shown; findings below may reference images not displayed]

FINDINGS: The right chest tube remains in good position. The small right
pneumothorax appears to be smaller in the interval. No left-sided
pneumothorax. Bilateral pulmonary infiltrates persist, unchanged.
The cardiomediastinal silhouette is unchanged.
IMPRESSION: 1. The small right pneumothorax is smaller in the interval. The
right chest tube is stable.
2. Stable bilateral pulmonary infiltrates consistent with U1IXL-YE
pneumonia.

## 2022-01-06 IMAGING — DX DG CHEST 1V PORT
1 series · 1 of 1 positions shown · non-contrast
Comparison: 04/20/2020.  CT 04/20/2020.

CLINICAL DATA: Acute respiratory failure.  Hypoxia.  COVID.

EXAM:
PORTABLE CHEST 1 VIEW

[chest ap]
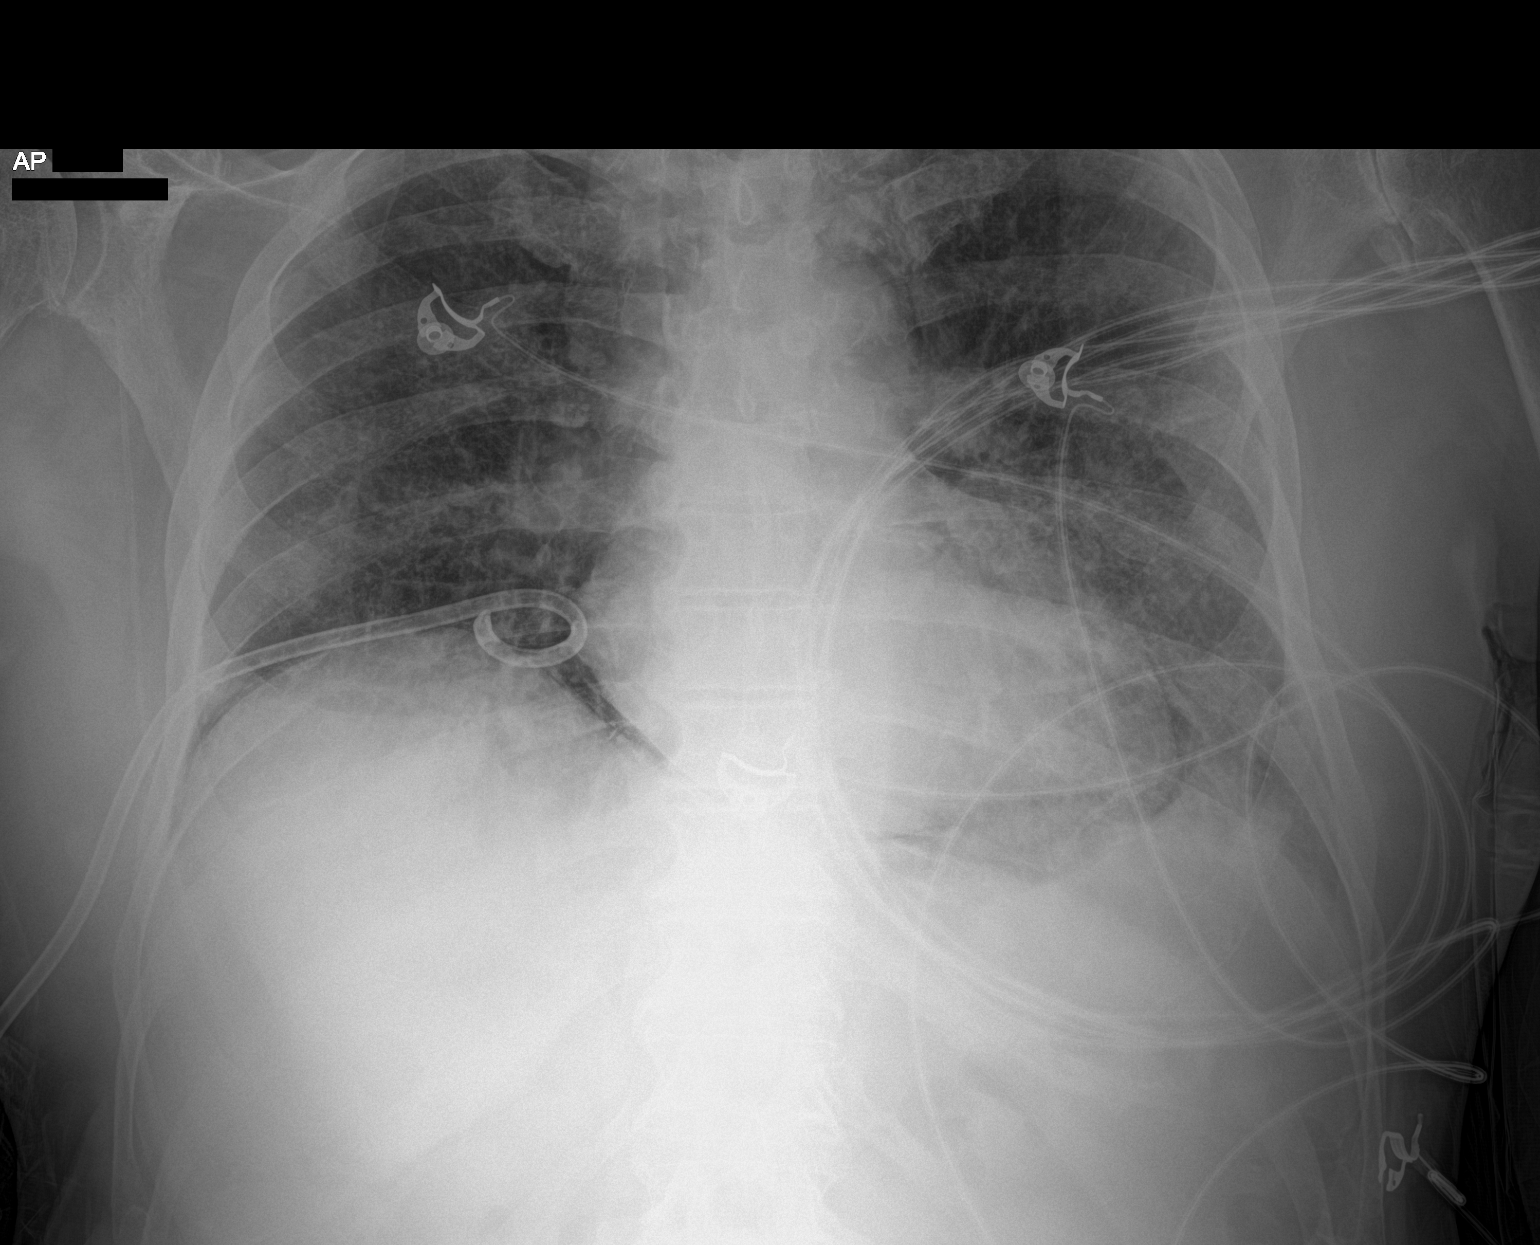

[1 of 1 positions shown; findings below may reference images not displayed]

FINDINGS: Right chest tube in stable position. Tiny right pneumothorax again
noted without interim change. Diffuse bilateral pulmonary
interstitial infiltrates are again noted. Similar findings noted on
prior exam. Low lung volumes. Heart size stable. No acute bony
abnormality.
IMPRESSION: 1. Right chest tube in stable position. Tiny right pneumothorax
again noted without interim change.
2. Diffuse bilateral pulmonary interstitial infiltrates are again
noted without interim change. Low lung volumes.

## 2022-01-07 IMAGING — DX DG CHEST 1V PORT
1 series · 1 of 1 positions shown · non-contrast
Comparison: 04/21/2020.  CT chest 04/20/2020.

CLINICAL DATA: ARDS.  COVID.

EXAM:
PORTABLE CHEST 1 VIEW

[chest ap]
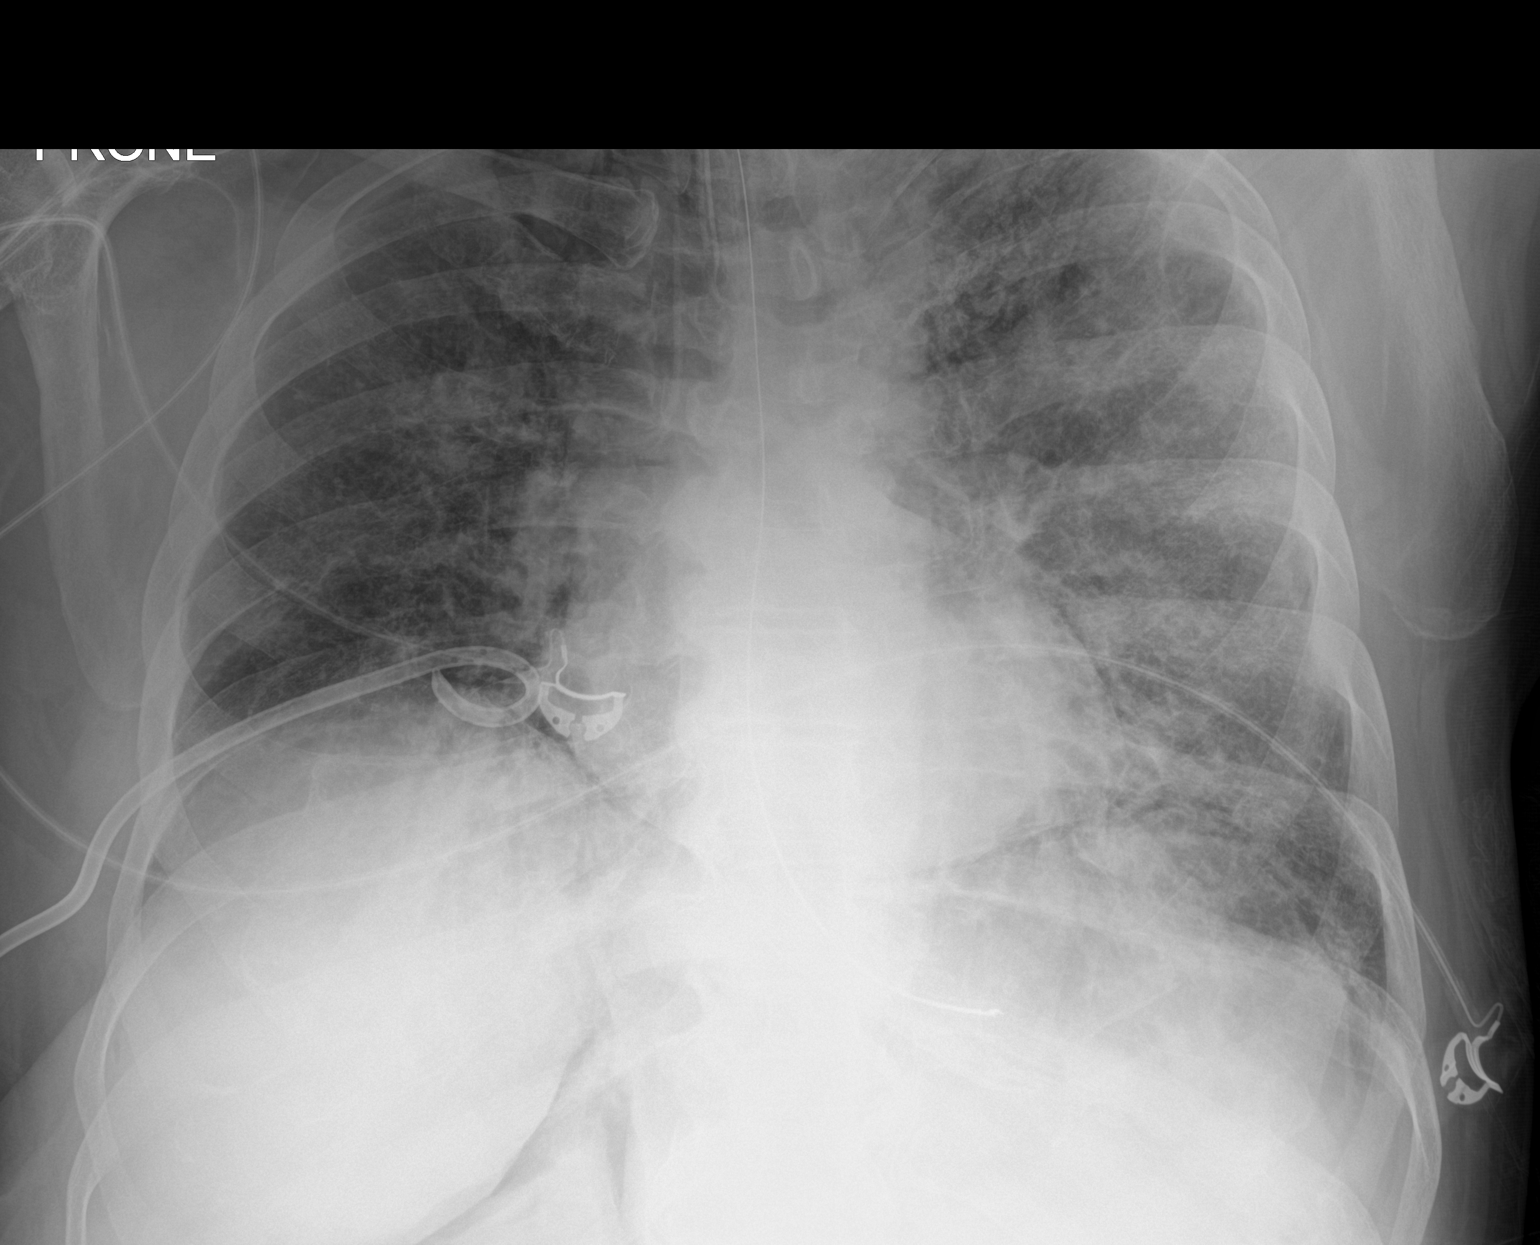

[1 of 1 positions shown; findings below may reference images not displayed]

FINDINGS: Endotracheal tube, NG tube, right chest tube in stable position.
Tiny residual right apical pneumothorax cannot be excluded.
Pneumomediastinum cannot be excluded. Right supraclavicular
subcutaneous emphysema noted. Diffuse bilateral pulmonary
infiltrates again noted. Heart size stable.
IMPRESSION: 1. Lines and tubes including right chest tube in stable position.
Tiny residual right apical pneumothorax cannot be excluded.
Pneumomediastinum cannot be excluded. Right supraclavicular
subcutaneous emphysema noted.
2. Diffuse bilateral pulmonary infiltrates again noted. No interim
change.
